# Patient Record
Sex: Female | Born: 1960 | Race: White | Hispanic: No | Smoking: Never smoker
Health system: Southern US, Community
[De-identification: ages and names within clinical notes are randomized; demographics above are authoritative.]

## PROBLEM LIST (undated history)

## (undated) DIAGNOSIS — K5010 Crohn's disease of large intestine without complications: Secondary | ICD-10-CM

## (undated) DIAGNOSIS — I10 Essential (primary) hypertension: Secondary | ICD-10-CM

---

## 2018-07-14 ENCOUNTER — Inpatient Hospital Stay (HOSPITAL_COMMUNITY)
Admission: EM | Admit: 2018-07-14 | Discharge: 2018-07-16 | DRG: 065 | Disposition: A | Discharge status: Home / Self Care | Payer: PRIVATE HEALTH INSURANCE | Attending: Internal Medicine | Admitting: Internal Medicine

## 2018-07-14 ENCOUNTER — Emergency Department (HOSPITAL_COMMUNITY): Payer: PRIVATE HEALTH INSURANCE

## 2018-07-14 ENCOUNTER — Encounter (HOSPITAL_COMMUNITY): Payer: Self-pay | Admitting: Emergency Medicine

## 2018-07-14 ENCOUNTER — Other Ambulatory Visit: Payer: Self-pay

## 2018-07-14 DIAGNOSIS — Z823 Family history of stroke: Secondary | ICD-10-CM

## 2018-07-14 DIAGNOSIS — Z9049 Acquired absence of other specified parts of digestive tract: Secondary | ICD-10-CM

## 2018-07-14 DIAGNOSIS — Z881 Allergy status to other antibiotic agents status: Secondary | ICD-10-CM

## 2018-07-14 DIAGNOSIS — K5010 Crohn's disease of large intestine without complications: Secondary | ICD-10-CM | POA: Diagnosis present

## 2018-07-14 DIAGNOSIS — E785 Hyperlipidemia, unspecified: Secondary | ICD-10-CM | POA: Diagnosis present

## 2018-07-14 DIAGNOSIS — R471 Dysarthria and anarthria: Secondary | ICD-10-CM | POA: Diagnosis present

## 2018-07-14 DIAGNOSIS — Z79899 Other long term (current) drug therapy: Secondary | ICD-10-CM

## 2018-07-14 DIAGNOSIS — Z882 Allergy status to sulfonamides status: Secondary | ICD-10-CM

## 2018-07-14 DIAGNOSIS — N179 Acute kidney failure, unspecified: Secondary | ICD-10-CM | POA: Diagnosis not present

## 2018-07-14 DIAGNOSIS — I639 Cerebral infarction, unspecified: Principal | ICD-10-CM | POA: Diagnosis present

## 2018-07-14 DIAGNOSIS — Z7952 Long term (current) use of systemic steroids: Secondary | ICD-10-CM

## 2018-07-14 DIAGNOSIS — R040 Epistaxis: Secondary | ICD-10-CM | POA: Diagnosis not present

## 2018-07-14 DIAGNOSIS — I63541 Cerebral infarction due to unspecified occlusion or stenosis of right cerebellar artery: Secondary | ICD-10-CM

## 2018-07-14 DIAGNOSIS — I480 Paroxysmal atrial fibrillation: Secondary | ICD-10-CM | POA: Diagnosis present

## 2018-07-14 DIAGNOSIS — R29702 NIHSS score 2: Secondary | ICD-10-CM | POA: Diagnosis present

## 2018-07-14 DIAGNOSIS — K5090 Crohn's disease, unspecified, without complications: Secondary | ICD-10-CM

## 2018-07-14 DIAGNOSIS — G8324 Monoplegia of upper limb affecting left nondominant side: Secondary | ICD-10-CM | POA: Diagnosis present

## 2018-07-14 DIAGNOSIS — Z20828 Contact with and (suspected) exposure to other viral communicable diseases: Secondary | ICD-10-CM | POA: Diagnosis present

## 2018-07-14 DIAGNOSIS — T380X5A Adverse effect of glucocorticoids and synthetic analogues, initial encounter: Secondary | ICD-10-CM | POA: Diagnosis present

## 2018-07-14 DIAGNOSIS — Z7902 Long term (current) use of antithrombotics/antiplatelets: Secondary | ICD-10-CM

## 2018-07-14 DIAGNOSIS — Z8249 Family history of ischemic heart disease and other diseases of the circulatory system: Secondary | ICD-10-CM

## 2018-07-14 DIAGNOSIS — I10 Essential (primary) hypertension: Secondary | ICD-10-CM | POA: Diagnosis present

## 2018-07-14 DIAGNOSIS — Z880 Allergy status to penicillin: Secondary | ICD-10-CM

## 2018-07-14 DIAGNOSIS — Z886 Allergy status to analgesic agent status: Secondary | ICD-10-CM

## 2018-07-14 DIAGNOSIS — E860 Dehydration: Secondary | ICD-10-CM | POA: Diagnosis present

## 2018-07-14 HISTORY — DX: Essential (primary) hypertension: I10

## 2018-07-14 HISTORY — DX: Crohn's disease of large intestine without complications: K50.10

## 2018-07-14 LAB — CBC
HCT: 38.7 % (ref 36.0–46.0)
Hemoglobin: 12.5 g/dL (ref 12.0–15.0)
MCH: 29.3 pg (ref 26.0–34.0)
MCHC: 32.3 g/dL (ref 30.0–36.0)
MCV: 90.6 fL (ref 80.0–100.0)
Platelets: 286 10*3/uL (ref 150–400)
RBC: 4.27 MIL/uL (ref 3.87–5.11)
RDW: 12.8 % (ref 11.5–15.5)
WBC: 19.1 10*3/uL — ABNORMAL HIGH (ref 4.0–10.5)
nRBC: 0 % (ref 0.0–0.2)

## 2018-07-14 LAB — DIFFERENTIAL
Abs Immature Granulocytes: 0.11 10*3/uL — ABNORMAL HIGH (ref 0.00–0.07)
Basophils Absolute: 0.1 10*3/uL (ref 0.0–0.1)
Basophils Relative: 1 %
Eosinophils Absolute: 0.2 10*3/uL (ref 0.0–0.5)
Eosinophils Relative: 1 %
Immature Granulocytes: 1 %
Lymphocytes Relative: 5 %
Lymphs Abs: 1 10*3/uL (ref 0.7–4.0)
Monocytes Absolute: 0.7 10*3/uL (ref 0.1–1.0)
Monocytes Relative: 4 %
Neutro Abs: 17 10*3/uL — ABNORMAL HIGH (ref 1.7–7.7)
Neutrophils Relative %: 88 %

## 2018-07-14 LAB — HEMOGLOBIN A1C
Hgb A1c MFr Bld: 5.1 % (ref 4.8–5.6)
Mean Plasma Glucose: 99.67 mg/dL

## 2018-07-14 LAB — COMPREHENSIVE METABOLIC PANEL
ALT: 18 U/L (ref 0–44)
AST: 25 U/L (ref 15–41)
Albumin: 3.3 g/dL — ABNORMAL LOW (ref 3.5–5.0)
Alkaline Phosphatase: 79 U/L (ref 38–126)
Anion gap: 11 (ref 5–15)
BUN: 24 mg/dL — ABNORMAL HIGH (ref 6–20)
CO2: 15 mmol/L — ABNORMAL LOW (ref 22–32)
Calcium: 8.7 mg/dL — ABNORMAL LOW (ref 8.9–10.3)
Chloride: 109 mmol/L (ref 98–111)
Creatinine, Ser: 1.51 mg/dL — ABNORMAL HIGH (ref 0.44–1.00)
GFR calc Af Amer: 43 mL/min — ABNORMAL LOW (ref >60)
GFR calc non Af Amer: 37 mL/min — ABNORMAL LOW (ref >60)
Glucose, Bld: 94 mg/dL (ref 70–99)
Potassium: 4.4 mmol/L (ref 3.5–5.1)
Sodium: 135 mmol/L (ref 135–145)
Total Bilirubin: 0.6 mg/dL (ref 0.3–1.2)
Total Protein: 5.7 g/dL — ABNORMAL LOW (ref 6.5–8.1)

## 2018-07-14 LAB — APTT: aPTT: 32 seconds (ref 24–36)

## 2018-07-14 LAB — LIPID PANEL
Cholesterol: 190 mg/dL (ref 0–200)
HDL: 58 mg/dL (ref >40)
LDL Cholesterol: 103 mg/dL — ABNORMAL HIGH (ref 0–99)
Total CHOL/HDL Ratio: 3.3 RATIO
Triglycerides: 144 mg/dL (ref <150)
VLDL: 29 mg/dL (ref 0–40)

## 2018-07-14 LAB — PROTIME-INR
INR: 1.1 (ref 0.8–1.2)
Prothrombin Time: 14.1 seconds (ref 11.4–15.2)

## 2018-07-14 MED ORDER — SENNOSIDES-DOCUSATE SODIUM 8.6-50 MG PO TABS: 1.0000 | ORAL_TABLET | Freq: Every evening | ORAL | Status: DC | PRN

## 2018-07-14 MED ORDER — SODIUM CHLORIDE 0.9 % IV BOLUS
1000.0000 mL | Freq: Once | INTRAVENOUS | Status: AC
  Administered 2018-07-14: 18:00:00 1000 mL via INTRAVENOUS

## 2018-07-14 MED ORDER — STROKE: EARLY STAGES OF RECOVERY BOOK
Freq: Once | Status: DC
  Filled 2018-07-14: qty 1

## 2018-07-14 MED ORDER — ACETAMINOPHEN 650 MG RE SUPP: 650.0000 mg | RECTAL | Status: DC | PRN

## 2018-07-14 MED ORDER — CLOPIDOGREL BISULFATE 75 MG PO TABS: 75.0000 mg | ORAL_TABLET | Freq: Every day | ORAL | Status: DC

## 2018-07-14 MED ORDER — CLOPIDOGREL BISULFATE 300 MG PO TABS: 600.0000 mg | ORAL_TABLET | Freq: Once | ORAL | Status: DC

## 2018-07-14 MED ORDER — ENOXAPARIN SODIUM 30 MG/0.3ML ~~LOC~~ SOLN
30.0000 mg | SUBCUTANEOUS | Status: DC
  Administered 2018-07-15 – 2018-07-16 (×2): 30 mg via SUBCUTANEOUS
  Filled 2018-07-14 (×2): qty 0.3

## 2018-07-14 MED ORDER — PREDNISONE 5 MG PO TABS
5.0000 mg | ORAL_TABLET | Freq: Every day | ORAL | Status: DC
  Administered 2018-07-15 – 2018-07-16 (×2): 5 mg via ORAL
  Filled 2018-07-14 (×2): qty 1

## 2018-07-14 MED ORDER — MORPHINE SULFATE (PF) 4 MG/ML IV SOLN: 4.0000 mg | Freq: Once | INTRAVENOUS | Status: DC

## 2018-07-14 MED ORDER — ATORVASTATIN CALCIUM 80 MG PO TABS
80.0000 mg | ORAL_TABLET | Freq: Every day | ORAL | Status: DC
  Administered 2018-07-14 – 2018-07-16 (×3): 80 mg via ORAL
  Filled 2018-07-14 (×3): qty 1

## 2018-07-14 MED ORDER — SODIUM CHLORIDE 0.9 % IV BOLUS: 500.0000 mL | Freq: Once | INTRAVENOUS | Status: DC

## 2018-07-14 MED ORDER — ACETAMINOPHEN 325 MG PO TABS
650.0000 mg | ORAL_TABLET | ORAL | Status: DC | PRN
  Administered 2018-07-15: 650 mg via ORAL
  Filled 2018-07-14: qty 2

## 2018-07-14 MED ORDER — CLOPIDOGREL BISULFATE 75 MG PO TABS
75.0000 mg | ORAL_TABLET | Freq: Every day | ORAL | Status: DC
  Administered 2018-07-14 – 2018-07-16 (×3): 75 mg via ORAL
  Filled 2018-07-14 (×3): qty 1

## 2018-07-14 MED ORDER — ACETAMINOPHEN 160 MG/5ML PO SOLN: 650.0000 mg | ORAL | Status: DC | PRN

## 2018-07-14 NOTE — ED Notes (Signed)
ED TO INPATIENT HANDOFF REPORT  ED Nurse Name and Phone #: William Hamburger [phone]  S Name/Age/Gender Patricia Livingston 58 y.o. female Room/Bed: 034C/034C  Code Status   Code Status: Full Code  Home/SNF/Other Home Patient oriented to: situation Is this baseline? No   Triage Complete: Triage complete  Chief Complaint left side weakness  Triage Note Patient reports she threw her neck back Sunday and felt a pop, she then began having numbness in bilateral hands, states R hand numbness resolved but left did not. She noticed weakness to her left side as well as slurred speech yesterday at noon. Patient ambulatory, a/ox4, speech clear, face symmetrical.    Allergies Allergies  Allergen Reactions  . Penicillins Anaphylaxis and Other (See Comments)    Did it involve swelling of the face/tongue/throat, SOB, or low BP? Yes Did it involve sudden or severe rash/hives, skin peeling, or any reaction on the inside of your mouth or nose? Yes Did you need to seek medical attention at a hospital or doctor's office? Yes When did it last happen?childhood If all above answers are "NO", may proceed with cephalosporin use.   . Aspirin Itching, Rash and Tinitus  . Sulfa Antibiotics Hives, Swelling and Rash    Level of Care/Admitting Diagnosis ED Disposition    ED Disposition Condition Comment   Admit  Hospital Area: [facility] [[id number]]  Level of Care: Telemetry Cardiac [103]  Covid Evaluation: Screening Protocol (No Symptoms)  Diagnosis: CVA (cerebral vascular accident) [facility]) [[id number]]  Admitting Physician: Aldine Contes [phone]  Attending Physician: Aldine Contes [[id number]]  PT Class (Do Not Modify): Observation [104]  PT Acc Code (Do Not Modify): Observation [10022]       B Medical/Surgery History Past Medical History:  Diagnosis Date  . Crohn's colitis ([place])   . Hypertension    History reviewed. No pertinent surgical history.   A IV  Location/Drains/Wounds Patient Lines/Drains/Airways Status   Active Line/Drains/Airways    Name:   Placement date:   Placement time:   Site:   Days:   Peripheral IV 07/14/18 Left;Medial Hand   07/14/18    1120    Hand   less than 1          Intake/Output Last 24 hours No intake or output data in the 24 hours ending 07/14/18 1807  Labs/Imaging Results for orders placed or performed during the hospital encounter of 07/14/18 (from the past 48 hour(s))  CBC     Status: Abnormal   Collection Time: 07/14/18 11:10 AM  Result Value Ref Range   WBC 19.1 (H) 4.0 - 10.5 K/uL   RBC 4.27 3.87 - 5.11 MIL/uL   Hemoglobin 12.5 12.0 - 15.0 g/dL   HCT 38.7 36.0 - 46.0 %   MCV 90.6 80.0 - 100.0 fL   MCH 29.3 26.0 - 34.0 pg   MCHC 32.3 30.0 - 36.0 g/dL   RDW 12.8 11.5 - 15.5 %   Platelets 286 150 - 400 K/uL   nRBC 0.0 0.0 - 0.2 %    Comment: Performed at [facility], [place] [address] [place], [place] [address]  Differential     Status: Abnormal   Collection Time: 07/14/18 11:10 AM  Result Value Ref Range   Neutrophils Relative % 88 %   Neutro Abs 17.0 (H) 1.7 - 7.7 K/uL   Lymphocytes Relative 5 %   Lymphs Abs 1.0 0.7 - 4.0 K/uL   Monocytes Relative 4 %   Monocytes Absolute 0.7 0.1 -  1.0 K/uL   Eosinophils Relative 1 %   Eosinophils Absolute 0.2 0.0 - 0.5 K/uL   Basophils Relative 1 %   Basophils Absolute 0.1 0.0 - 0.1 K/uL   Immature Granulocytes 1 %   Abs Immature Granulocytes 0.11 (H) 0.00 - 0.07 K/uL    Comment: Performed at [facility], [address] [place], [place] [address]  Comprehensive metabolic panel     Status: Abnormal   Collection Time: 07/14/18 11:10 AM  Result Value Ref Range   Sodium 135 135 - 145 mmol/L   Potassium 4.4 3.5 - 5.1 mmol/L   Chloride 109 98 - 111 mmol/L   CO2 15 (L) 22 - 32 mmol/L   Glucose, Bld 94 70 - 99 mg/dL   BUN 24 (H) 6 - 20 mg/dL   Creatinine, Ser 1.911.51 (H) 0.44 - 1.00 mg/dL   Calcium 8.7 (L) 8.9 - 10.3 mg/dL   Total Protein 5.7  (L) 6.5 - 8.1 g/dL   Albumin 3.3 (L) 3.5 - 5.0 g/dL   AST 25 15 - 41 U/L   ALT 18 0 - 44 U/L   Alkaline Phosphatase 79 38 - 126 U/L   Total Bilirubin 0.6 0.3 - 1.2 mg/dL   GFR calc non Af Amer 37 (L) >60 mL/min   GFR calc Af Amer 43 (L) >60 mL/min   Anion gap 11 5 - 15    Comment: Performed at [facility], [address] [place], [place] [address]  Protime-INR     Status: None   Collection Time: 07/14/18 12:10 PM  Result Value Ref Range   Prothrombin Time 14.1 11.4 - 15.2 seconds   INR 1.1 0.8 - 1.2    Comment: (NOTE) INR goal varies based on device and disease states. Performed at [facility], [address] [place], [place] [address]   APTT     Status: None   Collection Time: 07/14/18 12:10 PM  Result Value Ref Range   aPTT 32 24 - 36 seconds    Comment: Performed at [facility], [address] [place], [place] [address]  Hemoglobin A1c     Status: None   Collection Time: 07/14/18  5:30 PM  Result Value Ref Range   Hgb A1c MFr Bld 5.1 4.8 - 5.6 %    Comment: (NOTE) Pre diabetes:          5.7%-6.4% Diabetes:              >6.4% Glycemic control for   <7.0% adults with diabetes    Mean Plasma Glucose 99.67 mg/dL    Comment: Performed at [facility], [address] [place], [place] [address]   Ct Head Wo Contrast  Result Date: 07/14/2018 CLINICAL DATA:  Neck injury, popping sensation, numbness and weakness EXAM: CT HEAD WITHOUT CONTRAST CT CERVICAL SPINE WITHOUT CONTRAST TECHNIQUE: Multidetector CT imaging of the head and cervical spine was performed following the standard protocol without intravenous contrast. Multiplanar CT image reconstructions of the cervical spine were also generated. COMPARISON:  None. FINDINGS: CT HEAD FINDINGS Brain: There is subtle hypodensity of the right frontoparietal junction deep white matter (series 4, image 20, 19) Vascular: No hyperdense vessel or unexpected calcification. Skull: Normal. Negative for fracture or  focal lesion. Sinuses/Orbits: No acute finding. Other: None. CT CERVICAL SPINE FINDINGS Alignment: Normal. Skull base and vertebrae: No acute fracture. No primary bone lesion or focal pathologic process. Soft tissues and spinal canal: No  prevertebral fluid or swelling. No visible canal hematoma. Disc levels: Minimal multilevel disc space height loss and osteophytosis. Upper chest: Negative. Other: None. IMPRESSION: 1. There is subtle hypodensity of the right frontoparietal junction deep white matter (series 4, image 20, 19), concerning for acute to subacute infarction. MRI may be used to further evaluate for acute diffusion restriction if desired. 2. No fracture or static subluxation of the cervical spine. Minimal multilevel disc space height loss and osteophytosis. MRI may be used to more sensitively evaluate for cervical disc and neural foraminal pathology if desired. Electronically Signed   By: Lauralyn PrimesAlex  Bibbey M.D.   On: 07/14/2018 12:08   Ct Cervical Spine Wo Contrast  Result Date: 07/14/2018 CLINICAL DATA:  Neck injury, popping sensation, numbness and weakness EXAM: CT HEAD WITHOUT CONTRAST CT CERVICAL SPINE WITHOUT CONTRAST TECHNIQUE: Multidetector CT imaging of the head and cervical spine was performed following the standard protocol without intravenous contrast. Multiplanar CT image reconstructions of the cervical spine were also generated. COMPARISON:  None. FINDINGS: CT HEAD FINDINGS Brain: There is subtle hypodensity of the right frontoparietal junction deep white matter (series 4, image 20, 19) Vascular: No hyperdense vessel or unexpected calcification. Skull: Normal. Negative for fracture or focal lesion. Sinuses/Orbits: No acute finding. Other: None. CT CERVICAL SPINE FINDINGS Alignment: Normal. Skull base and vertebrae: No acute fracture. No primary bone lesion or focal pathologic process. Soft tissues and spinal canal: No prevertebral fluid or swelling. No visible canal hematoma. Disc levels:  Minimal multilevel disc space height loss and osteophytosis. Upper chest: Negative. Other: None. IMPRESSION: 1. There is subtle hypodensity of the right frontoparietal junction deep white matter (series 4, image 20, 19), concerning for acute to subacute infarction. MRI may be used to further evaluate for acute diffusion restriction if desired. 2. No fracture or static subluxation of the cervical spine. Minimal multilevel disc space height loss and osteophytosis. MRI may be used to more sensitively evaluate for cervical disc and neural foraminal pathology if desired. Electronically Signed   By: Lauralyn PrimesAlex  Bibbey M.D.   On: 07/14/2018 12:08   Mr Angio Head Wo Contrast  Result Date: 07/14/2018 CLINICAL DATA:  Left-sided weakness.  Abnormal head CT. EXAM: MRI HEAD WITHOUT CONTRAST MRA HEAD WITHOUT CONTRAST TECHNIQUE: Multiplanar, multiecho pulse sequences of the brain and surrounding structures were obtained without intravenous contrast. Angiographic images of the head were obtained using MRA technique without contrast. COMPARISON:  CT studies same day FINDINGS: MRI HEAD FINDINGS Brain: Diffusion imaging shows patchy acute infarction at the right frontoparietal junction region, primarily involving the precentral gyrus but also with some involvement of the post central gyrus. There are a few punctate acute infarctions in both parietal lobes. 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 Elsewhere, brainstem is normal. There are old small vessel cerebellar infarctions. Cerebral hemispheres elsewhere show moderate chronic small-vessel changes of the deep and subcortical white matter. No mass lesion, hydrocephalus or extra-axial collection. Vascular: Major vessels at the base of the brain show flow. Skull and upper cervical spine: Negative Sinuses/Orbits: Paranasal sinuses are clear. Small mastoid effusion on the right orbits negative. Other: None MRA HEAD FINDINGS Both internal carotid  arteries are widely patent through the skull base and siphon regions. Cannot rule out fibromuscular change in both upper cervical internal carotids. The anterior and middle cerebral vessels are patent without proximal stenosis, aneurysm or vascular malformation. Both vertebral arteries are widely patent to the basilar. No basilar stenosis. Posterior circulation branch vessels are normal.  IMPRESSION: Patchy acute infarctions at the right frontoparietal junction involving both the precentral and postcentral gyri. Few other punctate acute infarctions in both parietal lobes. 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 Background pattern of chronic small vessel ischemic changes of the cerebellum and hemispheric white matter. Negative intracranial MR angiography of the large and medium size vessels. Possible fibromuscular change of the upper cervical internal carotid arteries. Small right mastoid effusion. Electronically Signed   By: Paulina FusiMark  Shogry M.D.   On: 07/14/2018 15:58   Mr Brain Wo Contrast  Result Date: 07/14/2018 CLINICAL DATA:  Left-sided weakness.  Abnormal head CT. EXAM: MRI HEAD WITHOUT CONTRAST MRA HEAD WITHOUT CONTRAST TECHNIQUE: Multiplanar, multiecho pulse sequences of the brain and surrounding structures were obtained without intravenous contrast. Angiographic images of the head were obtained using MRA technique without contrast. COMPARISON:  CT studies same day FINDINGS: MRI HEAD FINDINGS Brain: Diffusion imaging shows patchy acute infarction at the right frontoparietal junction region, primarily involving the precentral gyrus but also with some involvement of the post central gyrus. There are a few punctate acute infarctions in both parietal lobes. 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 Elsewhere, brainstem is normal. There are old small vessel cerebellar infarctions. Cerebral hemispheres elsewhere show  moderate chronic small-vessel changes of the deep and subcortical white matter. No mass lesion, hydrocephalus or extra-axial collection. Vascular: Major vessels at the base of the brain show flow. Skull and upper cervical spine: Negative Sinuses/Orbits: Paranasal sinuses are clear. Small mastoid effusion on the right orbits negative. Other: None MRA HEAD FINDINGS Both internal carotid arteries are widely patent through the skull base and siphon regions. Cannot rule out fibromuscular change in both upper cervical internal carotids. The anterior and middle cerebral vessels are patent without proximal stenosis, aneurysm or vascular malformation. Both vertebral arteries are widely patent to the basilar. No basilar stenosis. Posterior circulation branch vessels are normal. IMPRESSION: Patchy acute infarctions at the right frontoparietal junction involving both the precentral and postcentral gyri. Few other punctate acute infarctions in both parietal lobes. 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 Background pattern of chronic small vessel ischemic changes of the cerebellum and hemispheric white matter. Negative intracranial MR angiography of the large and medium size vessels. Possible fibromuscular change of the upper cervical internal carotid arteries. Small right mastoid effusion. Electronically Signed   By: Paulina FusiMark  Shogry M.D.   On: 07/14/2018 15:58    Pending Labs Unresulted Labs (From admission, onward)    Start     Ordered   07/15/18 0500  Basic metabolic panel  Tomorrow morning,   R     07/14/18 1653   07/14/18 1653  HIV antibody (Routine Testing)  Once,   STAT     07/14/18 1652   07/14/18 1651  Lipid panel  Once,   STAT    Comments: Fasting    07/14/18 1652   07/14/18 1232  Novel Coronavirus,NAA,(SEND-OUT TO REF LAB - TAT 24-48 hrs); Hosp Order  (Asymptomatic Patients Labs)  Once,   STAT    Question:  Rule Out  Answer:  Yes   07/14/18 1231           Vitals/Pain Today's Vitals   07/14/18 1232 07/14/18 1604 07/14/18 1605 07/14/18 1700  BP: 139/78 122/77  (!) 148/72  Pulse: 67 65  74  Resp: 12 15  18   Temp:      TempSrc:  SpO2: 99% 100%  99%  Weight:      Height:      PainSc:   3      Isolation Precautions No active isolations  Medications Medications   stroke: mapping our early stages of recovery book (has no administration in time range)  acetaminophen (TYLENOL) tablet 650 mg (has no administration in time range)    Or  acetaminophen (TYLENOL) solution 650 mg (has no administration in time range)    Or  acetaminophen (TYLENOL) suppository 650 mg (has no administration in time range)  senna-docusate (Senokot-S) tablet 1 tablet (has no administration in time range)  enoxaparin (LOVENOX) injection 30 mg (has no administration in time range)  predniSONE (DELTASONE) tablet 5 mg (has no administration in time range)  clopidogrel (PLAVIX) tablet 600 mg (has no administration in time range)    Followed by  clopidogrel (PLAVIX) tablet 75 mg (has no administration in time range)  atorvastatin (LIPITOR) tablet 80 mg (has no administration in time range)  sodium chloride 0.9 % bolus 1,000 mL (has no administration in time range)    Mobility walks Low fall risk   Focused Assessments Neuro Assessment Handoff:  Swallow screen pass? Yes    NIH Stroke Scale ( + Modified Stroke Scale Criteria)  Interval: Initial Level of Consciousness (1a.)   : Alert, keenly responsive LOC Questions (1b. )   +: Answers both questions correctly LOC Commands (1c. )   + : Performs both tasks correctly Best Gaze (2. )  +: Normal Visual (3. )  +: No visual loss Facial Palsy (4. )    : Normal symmetrical movements Motor Arm, Left (5a. )   +: No drift Motor Arm, Right (5b. )   +: No drift Motor Leg, Left (6a. )   +: No drift Motor Leg, Right (6b. )   +: No drift Limb Ataxia (7. ): Absent Sensory (8. )   +: Mild-to-moderate sensory  loss, patient feels pinprick is less sharp or is dull on the affected side, or there is a loss of superficial pain with pinprick, but patient is aware of being touched Best Language (9. )   +: No aphasia Dysarthria (10. ): Normal Extinction/Inattention (11.)   +: No Abnormality Modified SS Total  +: 1 Complete NIHSS TOTAL: 1 Last date known well: 07/13/18 Last time known well: 1200 Neuro Assessment:   Neuro Checks:   Initial (07/14/18 1122)  Last Documented NIHSS Modified Score: 1 (07/14/18 1604) Has TPA been given? No If patient is a Neuro Trauma and patient is going to OR before floor call report to 4N Charge nurse: [phone] or [phone]     R Recommendations: See Admitting Provider Note  Report given to:   Additional Notes: NIH: 1 for sensory; passes swallow screen. A&O x4; up ad lib; Pending MRA.

## 2018-07-14 NOTE — ED Notes (Signed)
Pt returned from CT °

## 2018-07-14 NOTE — ED Notes (Signed)
Patient transported to CT 

## 2018-07-14 NOTE — ED Provider Notes (Signed)
Pt seen by Dr Venora Maples.  Please see his note.  MRI does reveal an acute stroke.  Discussed findings with patient.  Will admit for further workup.  Neurology consulted.  Medical service consulted for admission.   Dorie Rank, MD 07/14/18 [phone]

## 2018-07-14 NOTE — ED Notes (Signed)
Pt in MRI.

## 2018-07-14 NOTE — ED Notes (Signed)
Patient assessed at the bridge by Dr. Venora Maples who advised patient not a code stroke at this time

## 2018-07-14 NOTE — ED Triage Notes (Signed)
Patient reports she threw her neck back Sunday and felt a pop, she then began having numbness in bilateral hands, states R hand numbness resolved but left did not. She noticed weakness to her left side as well as slurred speech yesterday at noon. Patient ambulatory, a/ox4, speech clear, face symmetrical.

## 2018-07-14 NOTE — H&P (Signed)
Date: 07/14/2018               Patient Name:  Patricia Livingston MRN: [id number]  DOB: 27-Nov-1960 Age / Sex: 58 y.o., female   PCP: [facility] [place] Service: Internal Medicine Teaching Service         Attending Physician: Dr. Dorie Rank, MD    First Contact: Dr. Gilberto Better Pager: [phone]  Second Contact: Dr. Kathi Ludwig Pager: [phone]       After Hours (After 5p/  First Contact Pager: [phone]  weekends / holidays): Second Contact Pager: [phone]   Chief Complaint: Left arm weakness  History of Present Illness:  Patricia Livingston is a 58 yo F w/ PMH of Crohn's disease s/p colectomy and HTN presenting to [facility] with complaints of left arm weakness. She was in her usual state of health until last Sunday when she was dozing off while sitting upright and swung her neck back with pain in her neck associated with numbness in her left arm. Pain subsided but noticed that her left arm felt weak and she had difficulty with coordination. She initially thought the symptoms would disappear with time but yesterday while at work, she noticed worsening weakness of her left arm with dysarthria and ambulatory dysfunction so she came to [facility] today for evaluation. She states she has never had similar symptoms in the past. She denies any hx of dysrhythmia, cardiac dysfunction, lower extremity swelling or long travels. Also denies fever, chills, nausea, vomiting, diarrhea.  In the ED, she was had a CT head and C-spine with acute vs subacute infarct noted on right frontoparietal junction. Follow up MRI/MRA showed acute infarctions at right frontoparietal junction as well as few in both parietal lobes.   Meds:  Current Meds  Medication Sig  . Cyanocobalamin (VITAMIN B-12) 2500 MCG SUBL Place 1 tablet under the tongue daily.  Marland Kitchen lisinopril (ZESTRIL) 20 MG tablet Take 20 mg by mouth daily.  . metoprolol tartrate (LOPRESSOR) 50 MG tablet Take 50 mg by mouth daily.  . Pediatric Multiple  Vit-C-FA (FLINSTONES GUMMIES OMEGA-3 DHA PO) Take 2 tablets by mouth daily.  . predniSONE (DELTASONE) 5 MG tablet Take 5 mg by mouth daily.   Allergies: Allergies as of 07/14/2018 - Review Complete 07/14/2018  Allergen Reaction Noted  . Penicillins Anaphylaxis and Other (See Comments) 07/14/2018  . Aspirin Itching, Rash, and Tinitus 07/14/2018  . Sulfa antibiotics Hives, Swelling, and Rash 07/14/2018   History reviewed. No pertinent past medical history.  Family History:  No significant family history Family History  Problem Relation Age of Onset  . Hypertension Mother   . Hypertension Father    Social History: Lives alone. 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 Denies tobacco, illicit substance use. Occasional alcohol drinker but does not keep alcohol at home.  Review of Systems: A complete ROS was negative except as per HPI.  Physical Exam: Blood pressure 122/77, pulse 65, temperature 97.7 F (36.5 C), temperature source Oral, resp. rate 15, height 5\' 6"  (1.676 m), weight 74.4 kg, SpO2 100 %.  Gen: Well-developed, well nourished, NAD HEENT: EOMI, PERRL, No nasal discharge, MMM Neck: supple, ROM intact, no cervical adenopathy CV: RRR, S1, S2 normal, No rubs, no murmurs Pulm: CTAB, No rales, no wheezes, no dullness to percussion  Abd: Soft, BS+, NTND Extm: ROM intact, Peripheral pulses intact, No peripheral edema Skin: Dry, Warm, normal turgor Neurologic exam: Mental status:  A&Ox3 Cranial Nerves:             II: PERRL             III, IV, VI: Extra-occular motions intact bilaterally             V, VII: Face symmetric, sensation intact in all 3 divisions               VIII: hearing normal to rubbing fingers bilaterally               IX, X: palate rises symmetrically             XI: Head turn and shoulder shrug normal bilaterally               XII: tongue midline    Motor: Strength 5/5 RUE, 3/5 LUE, 5/5 RLE, 5/5 LLE, fixed contracture of left hand Sensory:  Light touch intact and symmetric bilaterally  Coordination: There is no dysmetria on finger-to-nose. Heel to shin test normal. Psychiatric: Normal mood and affect  EKG: personally reviewed my interpretation is no prior EKG to compare, normal sinus, normal axis, T wave inversion in V1  CT HEAD/Cervical Spine WITHOUT CONTRAST IMPRESSION: 1. There is subtle hypodensity of the right frontoparietal junction deep white matter (series 4, image 20, 19), concerning for acute to subacute infarction. MRI may be used to further evaluate for acute diffusion restriction if desired.  2. No fracture or static subluxation of the cervical spine. Minimal multilevel disc space height loss and osteophytosis. MRI may be used to more sensitively evaluate for cervical disc and neural foraminal pathology if desired.  MRI HEAD WITHOUT CONTRAST IMPRESSION: Patchy acute infarctions at the right frontoparietal junction involving both the precentral and postcentral gyri. Few other punctate acute infarctions in both parietal lobes. 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 Background pattern of chronic small vessel ischemic changes of the cerebellum and hemispheric white matter.  Negative intracranial MR angiography of the large and medium size vessels. Possible fibromuscular change of the upper cervical internal carotid arteries.  Small right mastoid effusion.  Assessment & Plan by Problem: Active Problems:   * No active hospital problems. *  Patricia Livingston is a 58 yo F w/ PMH of Crohn's disease s/p colectomy and HTN presenting with LUE weakness 2/2 multi-focal stroke. High likelihood of embolic origin based on distribution of stroke but unclear of origin as she has very low risk factors with no hx of tobacco use and arrhythmia. Will need further stroke work-up with echo, telemetry, MRA neck.  LUE weakness 2/2 multifocal stroke MRI head: acute infarctions  in right frontoparietal junction + both parietal lobes. MRA neck ordered in ED. No hx of diabetes or hypercholesteremia or tobacco use. - Neurology consult - Allow for permissive HTN in the setting of CVA (systolic < 220 and diastolic < 120) - Clopidogrel 600mg  in lieu of aspirin due to asa allergy - Atorvastatin 80mg  - TTE - F/u MRA neck - A1C, Lipid panel  - Telemonitoring  - SLP eval - PT/OT  AKI vs CKD Unclear baseline, admit BUN 24, Creatinine 1.51 Possibly from dehydration. - NaCl bolus 1000cc - Trend renal function - Avoid nephrotoxic meds  Hx of Crohn's Disease On prednisone 5mg . Wbc 19.1 most likely due to steroids. Pt mentions not on other medications for Crohn's dz due to being allergic - C/w prednisone 5mg  daily  HTN On lisinopril and metoprolol tartrate at home - Holding home  bp meds in setting of stroke  DVT prophx: Lovenox Diet: Regular Bowel: Senokot Code: Full  Dispo: Admit patient to Observation with expected length of stay less than 2 midnights.  Signed: Theotis Barrio, MD 07/14/2018, 4:35 PM  Pager: [phone]

## 2018-07-14 NOTE — Consult Note (Addendum)
Neurology Consultation  Reason for Consult: Stroke Referring Physician: Lynelle DoctorKnapp  CC: Left arm weakness and leg weakness  History is obtained from: Patient  HPI: Patricia Livingston is a 58 y.o. female with past medical history of hypertension and Crohn's colitis-on no immune suppression other than prednisone 5 mg daily.  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  On Sunday morning 07/09/2018 she was up and started to fall asleep at around 6.  She noted that her head was nodding off and falling forward then she suddenly felt a "snap" and right after felt as though her left greater then right hand and arm were slightly numb.  This subsided slightly and she did not think much of it.  Her right arm was better so she did not seek any medical attention.  But left-sided weakness persisted and today which is approximately 4/5 days later she felt great her left arm numbness and significant weakness in left hand.  For this reason she came in to be evaluated for stroke.  She states that she does not take aspirin as in the past she felt as though it caused her to have a rash.  Patient denies smoking, alcohol abuse or illicit drug use.  Patient denies any other symptoms other than the left arm and hand weakness.  Hand greater than proximal arm.  And some left leg weakness.  ED course: CT head, CT cervical spine, MRA, MRI of brain  LKW: 07/09/2018 tpa given?: no, out of the window Premorbid modified Rankin scale (mRS): 0 NIH stroke scale of 2  ROS:  ROS was performed and is negative except as noted in the HPI.    Past Medical History:  Diagnosis Date  . Crohn's colitis (HCC)   . Hypertension     Family History  Problem Relation Age of Onset  . Hypertension Mother   . Hypertension Father    Social History:  Patient does not smoke and states that she does not drink or abuse illicit drugs  Medications No current facility-administered medications for this encounter.   Current  Outpatient Medications:  .  Cyanocobalamin (VITAMIN B-12) 2500 MCG SUBL, Place 1 tablet under the tongue daily., Disp: , Rfl:  .  lisinopril (ZESTRIL) 20 MG tablet, Take 20 mg by mouth daily., Disp: , Rfl:  .  metoprolol tartrate (LOPRESSOR) 50 MG tablet, Take 50 mg by mouth daily., Disp: , Rfl:  .  Pediatric Multiple Vit-C-FA (FLINSTONES GUMMIES OMEGA-3 DHA PO), Take 2 tablets by mouth daily., Disp: , Rfl:  .  predniSONE (DELTASONE) 5 MG tablet, Take 5 mg by mouth daily., Disp: , Rfl:    Exam: Current vital signs: BP 122/77 (BP Location: Right Arm)   Pulse 65   Temp 97.7 F (36.5 C) (Oral)   Resp 15   Ht 5\' 6"  (1.676 m)   Wt 74.4 kg   SpO2 100%   BMI 26.47 kg/m  Vital signs in last 24 hours: Temp:  [97.7 F (36.5 C)] 97.7 F (36.5 C) (06/19 1102) Pulse Rate:  [64-68] 65 (06/19 1604) Resp:  [12-20] 15 (06/19 1604) BP: (122-150)/(77-81) 122/77 (06/19 1604) SpO2:  [99 %-100 %] 100 % (06/19 1604) Weight:  [74.4 kg] 74.4 kg (06/19 1113)  Physical Exam  Constitutional: Appears well-developed and well-nourished.  Psych: Affect appropriate to situation Eyes: No scleral injection HENT: No OP obstrucion Head: Normocephalic.  Cardiovascular: Normal rate and regular rhythm.  Respiratory: Effort normal, non-labored breathing GI: Soft.  No distension. There is no tenderness.  Skin: WDI  Neuro: Mental Status: Patient is awake, alert, oriented to person, place, month, year, and situation. Patient is able to give a clear and coherent history. No signs of aphasia or neglect Cranial Nerves: II: Visual Fields are full.  III,IV, VI: EOMI without ptosis or diploplia. Pupils equal, round and reactive to light V: Facial sensation is symmetric to temperature VII: Facial movement is symmetric.  VIII: hearing is intact to voice X: Palat elevates symmetrically XI: Shoulder shrug is symmetric. XII: tongue is midline without atrophy or fasciculations.  Motor: Tone is normal. Bulk is  normal.  Left proximal arm is 4/5 with significant weakness in the left grip and extension of fingers.  Left leg has a 4+/5 strength more proximally than distally with plantar flexion and plantar extension 5/5 Sensory: Sensation decreased on the left arm and leg Deep Tendon Reflexes: 2+ and symmetric in the biceps and patellae.  Plantars: Toes are downgoing bilaterally.  Cerebellar: FNF and HKS are intact bilaterally  Labs I have reviewed labs in epic and the results pertinent to this consultation are:   CBC    Component Value Date/Time   WBC 19.1 (H) 07/14/2018 1110   RBC 4.27 07/14/2018 1110   HGB 12.5 07/14/2018 1110   HCT 38.7 07/14/2018 1110   PLT 286 07/14/2018 1110   MCV 90.6 07/14/2018 1110   MCH 29.3 07/14/2018 1110   MCHC 32.3 07/14/2018 1110   RDW 12.8 07/14/2018 1110   LYMPHSABS 1.0 07/14/2018 1110   MONOABS 0.7 07/14/2018 1110   EOSABS 0.2 07/14/2018 1110   BASOSABS 0.1 07/14/2018 1110    CMP     Component Value Date/Time   NA 135 07/14/2018 1110   K 4.4 07/14/2018 1110   CL 109 07/14/2018 1110   CO2 15 (L) 07/14/2018 1110   GLUCOSE 94 07/14/2018 1110   BUN 24 (H) 07/14/2018 1110   CREATININE 1.51 (H) 07/14/2018 1110   CALCIUM 8.7 (L) 07/14/2018 1110   PROT 5.7 (L) 07/14/2018 1110   ALBUMIN 3.3 (L) 07/14/2018 1110   AST 25 07/14/2018 1110   ALT 18 07/14/2018 1110   ALKPHOS 79 07/14/2018 1110   BILITOT 0.6 07/14/2018 1110   GFRNONAA 37 (L) 07/14/2018 1110   GFRAA 43 (L) 07/14/2018 1110    Imaging I have reviewed the images obtained:  CT-scan of the brain-there is subtle hypodensity of the right frontoparietal junction deep white matter, concerning for acute subacute infarct.  MRI examination of the brain- patchy acute infarcts in the right frontoparietal junction involving both the precentral and postcentral gyri.  Few other punctate acute infarcts in both parietal lobes.  Presence of bilateral infarctions suggesting of embolic disease from the  heart and or a sending aorta.  MR a of head-negative intracranial MRA of the large and medium sized vessels.  Possible fibromuscular changes in the upper cervical internal carotid arteries  Felicie Mornavid Smith PA-C [facility] [phone]  M-F  (9:00 am- 5:00 PM)  07/14/2018, 4:50 PM   Attending addendum Patient seen and examined independently. Last known normal sometime last Sunday when she was nodding off falling asleep and quickly pulled her neck back feeling a crick in her neck after that she had bilateral hand weakness in the left-sided weakness and numbness persisted.  Initially she thought this might have something to do with her neck and did not make much of it but because of the persistent symptoms came in for stroke evaluation. I independently  obtained review of systems and agree with the one documented above. On my examination NIH stroke scale 2 for left arm drift as well as decrease sensation in the left-although she had some slight weakness in the left leg as well but no vertical drift. I have independently obtained family maternal grandmother and father strokes in older age.  No strokes in young in the family.  Assessment:  58 year old female with bilateral infarcts with the majority of the infarcts in the right frontoparietal and precentral and postcentral gyri on the right.  As infarcts are bilateral in different vascular distributions likelihood is that this is cardiomegaly stroke. If no atrial fibrillation is found on work-up, she might need long-term outpatient cardiac monitoring. Other than the MRI changes listed above, she also has questionable FMD on MRA of the head-will need CTA head and neck for further evaluation.  Recommend #CTA neck to evaluate further for possible a sending aortic atherosclerosis and or plaques and FMD #Transthoracic Echo, may need TEE and loop monitor  # Start patient on PLVX 75mg  (allergic to ASA) #Start or continue Atorvastatin 80  mg/other high intensity statin # BP goal: permissive HTN upto  130/90 as she has had symptoms for greater than 3 days # HBAIC and Lipid profile # Telemetry monitoring # Frequent neuro checks # NPO until passes stroke swallow screen # please page stroke NP  Or  PA  Or MD from 8am -4 pm  as this patient from this time will be  followed by the stroke.   You can look them up on [email or web address]  Password [email or web address]  -- Amie Portland, MD [facility] Pager: [phone] If 7pm to 7am, please call on call as listed on AMION.

## 2018-07-14 NOTE — Plan of Care (Signed)
See previous note

## 2018-07-14 NOTE — ED Provider Notes (Signed)
[facility] Provider Note   CSN: [id number] Arrival date & time: 07/14/18  1055     History   Chief Complaint Chief Complaint  Patient presents with   Weakness    HPI Patricia Livingston is a 58 y.o. female.     HPI 58 year old female presents the emergency department with noted weakness of her left side over the past 2 days worsening yesterday with new associated slurred speech yesterday.  She was having bilateral arm persistent symptoms last week and some mild neck pain but the neck pain is resolved and her right arm is completely normalized but the left arm is continued causing issues especially over the past 48 hours.  Yesterday she had more difficulty walking and felt like she was slightly weak in the left leg and off balance.  No prior history of stroke.  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  She has a history of hypertension.  No prior history of stroke.   History reviewed. No pertinent past medical history.  There are no active problems to display for this patient.   History reviewed. No pertinent surgical history.   OB History   No obstetric history on file.      Home Medications    Prior to Admission medications   Medication Sig Start Date End Date Taking? Authorizing Provider  Cyanocobalamin (VITAMIN B-12) 2500 MCG SUBL Place 1 tablet under the tongue daily.   Yes [provider]  lisinopril (ZESTRIL) 20 MG tablet Take 20 mg by mouth daily. 06/02/18  Yes [provider]  metoprolol tartrate (LOPRESSOR) 50 MG tablet Take 50 mg by mouth daily. 06/02/18  Yes [provider]  Pediatric Multiple Vit-C-FA (FLINSTONES GUMMIES OMEGA-3 DHA PO) Take 2 tablets by mouth daily.   Yes [provider]  predniSONE (DELTASONE) 5 MG tablet Take 5 mg by mouth daily. 06/02/18  Yes [provider]    Family History No family history on file.  Social History Social History   Tobacco Use    Smoking status: Not on file  Substance Use Topics   Alcohol use: Not on file   Drug use: Not on file     Allergies   Penicillins, Aspirin, and Sulfa antibiotics   Review of Systems Review of Systems  All other systems reviewed and are negative.    Physical Exam Updated Vital Signs BP 139/78    Pulse 67    Temp 97.7 F (36.5 C) (Oral)    Resp 12    Ht 5\' 6"  (1.676 m)    Wt 74.4 kg    SpO2 99%    BMI 26.47 kg/m   Physical Exam Vitals signs and nursing note reviewed.  Constitutional:      General: She is not in acute distress.    Appearance: She is well-developed.  HENT:     Head: Normocephalic and atraumatic.  Neck:     Musculoskeletal: Normal range of motion.  Cardiovascular:     Rate and Rhythm: Normal rate and regular rhythm.     Heart sounds: Normal heart sounds.  Pulmonary:     Effort: Pulmonary effort is normal.     Breath sounds: Normal breath sounds.  Abdominal:     General: There is no distension.     Palpations: Abdomen is soft.     Tenderness: There is no abdominal tenderness.  Musculoskeletal: Normal range of motion.  Skin:    General: Skin is warm and dry.  Neurological:     Mental Status: She is alert and oriented to person, place, and time.     Comments: Mild dysarthric speech.  Weak grip strength of the left hand.  No obvious weakness of the left lower extremity  Psychiatric:        Judgment: Judgment normal.      ED Treatments / Results  Labs (all labs ordered are listed, but only abnormal results are displayed) Labs Reviewed  CBC - Abnormal; Notable for the following components:      Result Value   WBC 19.1 (*)    All other components within normal limits  DIFFERENTIAL - Abnormal; Notable for the following components:   Neutro Abs 17.0 (*)    Abs Immature Granulocytes 0.11 (*)    All other components within normal limits  COMPREHENSIVE METABOLIC PANEL - Abnormal; Notable for the following components:   CO2 15 (*)    BUN 24 (*)     Creatinine, Ser 1.51 (*)    Calcium 8.7 (*)    Total Protein 5.7 (*)    Albumin 3.3 (*)    GFR calc non Af Amer 37 (*)    GFR calc Af Amer 43 (*)    All other components within normal limits  NOVEL CORONAVIRUS, NAA (HOSPITAL ORDER, SEND-OUT TO REF LAB)  PROTIME-INR  APTT    EKG EKG Interpretation  Date/Time:  Friday July 14 2018 11:15:12 EDT Ventricular Rate:  63 PR Interval:    QRS Duration: 83 QT Interval:  410 QTC Calculation: 420 R Axis:   53 Text Interpretation:  Sinus rhythm Short PR interval No old tracing to compare Confirmed by Jola Schmidt [phone]) on 07/14/2018 3:57:40 PM   Radiology Ct Head Wo Contrast  Result Date: 07/14/2018 CLINICAL DATA:  Neck injury, popping sensation, numbness and weakness EXAM: CT HEAD WITHOUT CONTRAST CT CERVICAL SPINE WITHOUT CONTRAST TECHNIQUE: Multidetector CT imaging of the head and cervical spine was performed following the standard protocol without intravenous contrast. Multiplanar CT image reconstructions of the cervical spine were also generated. COMPARISON:  None. FINDINGS: CT HEAD FINDINGS Brain: There is subtle hypodensity of the right frontoparietal junction deep white matter (series 4, image 20, 19) Vascular: No hyperdense vessel or unexpected calcification. Skull: Normal. Negative for fracture or focal lesion. Sinuses/Orbits: No acute finding. Other: None. CT CERVICAL SPINE FINDINGS Alignment: Normal. Skull base and vertebrae: No acute fracture. No primary bone lesion or focal pathologic process. Soft tissues and spinal canal: No prevertebral fluid or swelling. No visible canal hematoma. Disc levels: Minimal multilevel disc space height loss and osteophytosis. Upper chest: Negative. Other: None. IMPRESSION: 1. There is subtle hypodensity of the right frontoparietal junction deep white matter (series 4, image 20, 19), concerning for acute to subacute infarction. MRI may be used to further evaluate for acute diffusion restriction if desired.  2. No fracture or static subluxation of the cervical spine. Minimal multilevel disc space height loss and osteophytosis. MRI may be used to more sensitively evaluate for cervical disc and neural foraminal pathology if desired. Electronically Signed   By: Eddie Candle M.D.   On: 07/14/2018 12:08   Ct Cervical Spine Wo Contrast  Result Date: 07/14/2018 CLINICAL DATA:  Neck injury, popping sensation, numbness and weakness EXAM: CT HEAD WITHOUT CONTRAST CT CERVICAL SPINE WITHOUT CONTRAST TECHNIQUE: Multidetector CT imaging of the head and cervical spine was performed following the standard protocol without intravenous contrast. Multiplanar CT image reconstructions of the cervical spine were also generated. COMPARISON:  None. FINDINGS: CT HEAD FINDINGS Brain: There is subtle hypodensity of the right frontoparietal junction deep white matter (series 4, image 20, 19) Vascular: No hyperdense vessel or unexpected calcification. Skull: Normal. Negative for fracture or focal lesion. Sinuses/Orbits: No acute finding. Other: None. CT CERVICAL SPINE FINDINGS Alignment: Normal. Skull base and vertebrae: No acute fracture. No primary bone lesion or focal pathologic process. Soft tissues and spinal canal: No prevertebral fluid or swelling. No visible canal hematoma. Disc levels: Minimal multilevel disc space height loss and osteophytosis. Upper chest: Negative. Other: None. IMPRESSION: 1. There is subtle hypodensity of the right frontoparietal junction deep white matter (series 4, image 20, 19), concerning for acute to subacute infarction. MRI may be used to further evaluate for acute diffusion restriction if desired. 2. No fracture or static subluxation of the cervical spine. Minimal multilevel disc space height loss and osteophytosis. MRI may be used to more sensitively evaluate for cervical disc and neural foraminal pathology if desired. Electronically Signed   By: Lauralyn Primes M.D.   On: 07/14/2018 12:08     Procedures Procedures (including critical care time)  Medications Ordered in ED Medications - No data to display   Initial Impression / Assessment and Plan / ED Course  I have reviewed the triage vital signs and the nursing notes.  Pertinent labs & imaging results that were available during my care of the patient were reviewed by me and considered in my medical decision making (see chart for details).        Presentation concerning for right brain stroke.  CT scan with subtle abnormality concerning for possible subacute stroke.  MRI pending at this time.  Care transferred to Dr. Lynelle Doctor at 3:58 PM to follow-up on MRI imaging   Final Clinical Impressions(s) / ED Diagnoses   Final diagnoses:  None    ED Discharge Orders    None       Azalia Bilis, MD 07/14/18 1558

## 2018-07-14 NOTE — Plan of Care (Signed)
Pt stable, able to be independent with mobility. Pt performs ADLs. Pt progressing well and is anxious to return home. 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 Creatinine was found to be elevated at arrival to hospital and she wishes to wait for CTA w/ contrast until she knows that her kidneys will not be adversely affected by contrast (her husband passed due to kidney issues). PIV started by IV team in right AC 20g in case she agrees to CTA on 07/15/18.

## 2018-07-15 ENCOUNTER — Observation Stay (HOSPITAL_COMMUNITY): Payer: PRIVATE HEALTH INSURANCE

## 2018-07-15 ENCOUNTER — Encounter (HOSPITAL_COMMUNITY): Payer: PRIVATE HEALTH INSURANCE

## 2018-07-15 ENCOUNTER — Encounter (HOSPITAL_COMMUNITY): Payer: Self-pay

## 2018-07-15 DIAGNOSIS — I63541 Cerebral infarction due to unspecified occlusion or stenosis of right cerebellar artery: Secondary | ICD-10-CM | POA: Diagnosis not present

## 2018-07-15 DIAGNOSIS — N179 Acute kidney failure, unspecified: Secondary | ICD-10-CM | POA: Diagnosis present

## 2018-07-15 DIAGNOSIS — K5010 Crohn's disease of large intestine without complications: Secondary | ICD-10-CM | POA: Diagnosis present

## 2018-07-15 DIAGNOSIS — E785 Hyperlipidemia, unspecified: Secondary | ICD-10-CM | POA: Diagnosis present

## 2018-07-15 DIAGNOSIS — Z20828 Contact with and (suspected) exposure to other viral communicable diseases: Secondary | ICD-10-CM | POA: Diagnosis present

## 2018-07-15 DIAGNOSIS — I6300 Cerebral infarction due to thrombosis of unspecified precerebral artery: Secondary | ICD-10-CM

## 2018-07-15 DIAGNOSIS — Z8249 Family history of ischemic heart disease and other diseases of the circulatory system: Secondary | ICD-10-CM | POA: Diagnosis not present

## 2018-07-15 DIAGNOSIS — R040 Epistaxis: Secondary | ICD-10-CM | POA: Diagnosis not present

## 2018-07-15 DIAGNOSIS — T380X5A Adverse effect of glucocorticoids and synthetic analogues, initial encounter: Secondary | ICD-10-CM | POA: Diagnosis present

## 2018-07-15 DIAGNOSIS — I639 Cerebral infarction, unspecified: Secondary | ICD-10-CM | POA: Diagnosis present

## 2018-07-15 DIAGNOSIS — G8324 Monoplegia of upper limb affecting left nondominant side: Secondary | ICD-10-CM | POA: Diagnosis present

## 2018-07-15 DIAGNOSIS — Z823 Family history of stroke: Secondary | ICD-10-CM | POA: Diagnosis not present

## 2018-07-15 DIAGNOSIS — I10 Essential (primary) hypertension: Secondary | ICD-10-CM | POA: Diagnosis present

## 2018-07-15 DIAGNOSIS — Z9049 Acquired absence of other specified parts of digestive tract: Secondary | ICD-10-CM | POA: Diagnosis not present

## 2018-07-15 DIAGNOSIS — R471 Dysarthria and anarthria: Secondary | ICD-10-CM | POA: Diagnosis present

## 2018-07-15 DIAGNOSIS — Z880 Allergy status to penicillin: Secondary | ICD-10-CM | POA: Diagnosis not present

## 2018-07-15 DIAGNOSIS — Z886 Allergy status to analgesic agent status: Secondary | ICD-10-CM | POA: Diagnosis not present

## 2018-07-15 DIAGNOSIS — Z882 Allergy status to sulfonamides status: Secondary | ICD-10-CM | POA: Diagnosis not present

## 2018-07-15 DIAGNOSIS — R29702 NIHSS score 2: Secondary | ICD-10-CM | POA: Diagnosis present

## 2018-07-15 DIAGNOSIS — E860 Dehydration: Secondary | ICD-10-CM | POA: Diagnosis present

## 2018-07-15 LAB — BASIC METABOLIC PANEL
Anion gap: 8 (ref 5–15)
BUN: 17 mg/dL (ref 6–20)
CO2: 19 mmol/L — ABNORMAL LOW (ref 22–32)
Calcium: 8.6 mg/dL — ABNORMAL LOW (ref 8.9–10.3)
Chloride: 113 mmol/L — ABNORMAL HIGH (ref 98–111)
Creatinine, Ser: 1.17 mg/dL — ABNORMAL HIGH (ref 0.44–1.00)
GFR calc Af Amer: 59 mL/min — ABNORMAL LOW (ref >60)
GFR calc non Af Amer: 51 mL/min — ABNORMAL LOW (ref >60)
Glucose, Bld: 93 mg/dL (ref 70–99)
Potassium: 3.9 mmol/L (ref 3.5–5.1)
Sodium: 140 mmol/L (ref 135–145)

## 2018-07-15 LAB — NOVEL CORONAVIRUS, NAA (HOSP ORDER, SEND-OUT TO REF LAB; TAT 18-24 HRS): SARS-CoV-2, NAA: NOT DETECTED

## 2018-07-15 LAB — HIV ANTIBODY (ROUTINE TESTING W REFLEX): HIV Screen 4th Generation wRfx: NONREACTIVE

## 2018-07-15 MED ORDER — CLOPIDOGREL BISULFATE 75 MG PO TABS: 225.0000 mg | ORAL_TABLET | Freq: Once | ORAL | Status: DC

## 2018-07-15 MED ORDER — SODIUM CHLORIDE 0.9 % IV BOLUS
1000.0000 mL | Freq: Once | INTRAVENOUS | Status: AC
  Administered 2018-07-15: 1000 mL via INTRAVENOUS

## 2018-07-15 MED ORDER — IOHEXOL 350 MG/ML SOLN
50.0000 mL | Freq: Once | INTRAVENOUS | Status: AC | PRN
  Administered 2018-07-15: 50 mL via INTRAVENOUS

## 2018-07-15 NOTE — Progress Notes (Signed)
Spoke with neurology, they recommended consulting cardiology for TEE and having a loop recorder placed to look for cardiac source of embolism and paroxysmal A. fib.  Recommended aspirin and Plavix.  Patient has allergy to aspirin so they recommended loading with Plavix 300 today then continue Plavix 75 daily.  Patient received 75 mg Plavix today, will place order for 225 mg Plavix now and continue Plavix 75 daily.  Will consult cardiology.

## 2018-07-15 NOTE — Evaluation (Signed)
Occupational Therapy Evaluation Patient Details Name: Patricia Livingston MRN: [id number] DOB: 1960-10-24 Today's Date: 07/15/2018    History of Present Illness Ms. Gloria, 58y/f presejted to ED with complaints of left arm weakness and dysarthria for several days. MRI showed acute infarctions at right frontoparietal junction as well as few in both parietal lobes. Significant medical history includes Crohn's diseas s/p colectomy and HTN.     Clinical Impression   Pt PTAL lives alone, son nearby. Pt was working and independent prior. Pt currently limited by poor fine and gross motor coordination in LUE,decreased strength in LUE and decreased ability to perform ADL with L hand deficits. Pt currently performing ADL tasks with RUE mostly and LUE assisting for stabilizing. Pt mobility with modified independence in room with fair balance. Pt would benefit from continued OT skilled services for ADL, mobility and LUE HEP. OT following acutely. Pt's L hand is in need of OP OT to continue to assess and progress HEP for LUE.     Follow Up Recommendations  Outpatient OT(neuro out-pt. pt lives in [place] wants [employer] location)    Equipment Recommendations  None recommended by OT    Recommendations for Other Services       Precautions / Restrictions Precautions Precautions: Fall Restrictions Weight Bearing Restrictions: No      Mobility Bed Mobility Overal bed mobility: Modified Independent                Transfers Overall transfer level: Modified independent               General transfer comment: no physical assist required    Balance                                           ADL either performed or assessed with clinical judgement   ADL Overall ADL's : Needs assistance/impaired Eating/Feeding: Set up;Sitting Eating/Feeding Details (indicate cue type and reason): RUE with LUE stabilizing items Grooming: Wash/dry hands;Wash/dry face;Oral care;Brushing  hair;Supervision/safety;Standing   Upper Body Bathing: Set up;Sitting   Lower Body Bathing: Minimal assistance;Sitting/lateral leans;Sit to/from stand Lower Body Bathing Details (indicate cue type and reason): LUE poor FMC unable to assist with sock donning Upper Body Dressing : Set up;Sitting   Lower Body Dressing: Minimal assistance Lower Body Dressing Details (indicate cue type and reason): LUE poor FMC unable to assist with sock donning Toilet Transfer: Supervision/safety;Ambulation;Regular Toilet;Grab bars   Toileting- Clothing Manipulation and Hygiene: Supervision/safety;Sitting/lateral lean;Sit to/from stand       Functional mobility during ADLs: Supervision/safety General ADL Comments: requires assist as LUE w/ poor FMC- able to assist with stabilizing objects, but unable to assist with sock donning     Vision Baseline Vision/History: No visual deficits Vision Assessment?: No apparent visual deficits     Perception Perception Perception Tested?: Yes Comments: Pt with WNL proprioception in space with BUEs; sensation intact. Pt able close eyes and mimic position using LUE to mimic RUE after OT positioned RUE with pt's eyes closed.   Praxis      Pertinent Vitals/Pain Pain Assessment: 0-10 Pain Score: 3  Pain Location: neck, thoracic spine and headache Pain Descriptors / Indicators: Dull;Aching Pain Intervention(s): Limited activity within patient's tolerance;Monitored during session     Hand Dominance Right   Extremity/Trunk Assessment Upper Extremity Assessment Upper Extremity Assessment: Generalized weakness;LUE deficits/detail LUE Deficits / Details: decreased strength and decreased FMC. LUE  Sensation: WNL LUE Coordination: decreased fine motor;decreased gross motor   Lower Extremity Assessment Lower Extremity Assessment: Defer to PT evaluation   Cervical / Trunk Assessment Cervical / Trunk Assessment: Normal   Communication Communication Communication:  No difficulties   Cognition Arousal/Alertness: Awake/alert Behavior During Therapy: WFL for tasks assessed/performed Overall Cognitive Status: Within Functional Limits for tasks assessed                                     General Comments  Pt education heavily on LUE HEP given handout for [facility] and pt advised to watch RUE do exercise first prior to performing with LUE. 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    Exercises     Shoulder Instructions      Home Living Family/patient expects to be discharged to:: Private residence Living Arrangements: Alone Available Help at Discharge: Family;Available PRN/intermittently Type of Home: House Home Access: Stairs to enter [employer] of Steps: 8 Entrance Stairs-Rails: None Home Layout: Two level;1/2 bath on main level Alternate Level Stairs-Number of Steps: full flight   Bathroom Shower/Tub: Teacher, early years/pre: Standard     Home Equipment: None      Lives With: Alone    Prior Functioning/Environment Level of Independence: Independent        Comments: working and driving        OT Problem List: Decreased strength;Decreased activity tolerance;Impaired balance (sitting and/or standing);Decreased coordination;Decreased safety awareness;Impaired UE functional use;Pain      OT Treatment/Interventions: Self-care/ADL training;Therapeutic exercise;Neuromuscular education;Energy conservation;DME and/or AE instruction;Therapeutic activities;Patient/family education    OT Goals(Current goals can be found in the care plan section) Acute Rehab OT Goals Patient Stated Goal: to use my L hand again OT Goal Formulation: With patient Time For Goal Achievement: 07/28/18 Potential to Achieve Goals: Good ADL Goals Pt Will Perform Lower Body Dressing: with modified independence;sitting/lateral leans;sit to/from stand Pt Will Perform Toileting - Clothing Manipulation and hygiene: with  modified independence;sitting/lateral leans;sit to/from stand Pt/caregiver will Perform Home Exercise Program: Left upper extremity;Independently;With written HEP provided Additional ADL Goal #1: Pt will complete OOB ADL with Modified independence  OT Frequency: Min 3X/week   Barriers to D/C: Decreased caregiver support  pt stating that "I have a son in the area, but I don't expect him to assist me a lot of the time."       Co-evaluation              AM-PAC OT "6 Clicks" Daily Activity     Outcome Measure Help from another person eating meals?: A Little Help from another person taking care of personal grooming?: A Little Help from another person toileting, which includes using toliet, bedpan, or urinal?: A Little Help from another person bathing (including washing, rinsing, drying)?: A Little Help from another person to put on and taking off regular upper body clothing?: A Little Help from another person to put on and taking off regular lower body clothing?: A Little 6 Click Score: 18   End of Session Nurse Communication: Mobility status  Activity Tolerance: Patient tolerated treatment well Patient left: in bed;with call bell/phone within reach  OT Visit Diagnosis: Other symptoms and signs involving the nervous system (R29.898);Muscle weakness (generalized) (M62.81)                Time: 1219-1300 OT Time Calculation (min): 41 min Charges:  OT General  Charges $OT Visit: 1 Visit OT Evaluation $OT Eval Moderate Complexity: 1 Mod OT Treatments $Self Care/Home Management : 8-22 mins $Neuromuscular Re-education: 8-22 mins  Cristi LoronAllison (Jelenek) [place] OTR/L Acute Rehabilitation Services Pager: [phone] Office: [phone]   Gunnar FusiLLISON J Donnah Levert 07/15/2018, 1:16 PM

## 2018-07-15 NOTE — Progress Notes (Signed)
   Subjective:  Patricia Livingston is a 58 y.o. F with PMH of Crohn's Disease s/p colectomy, HTN admit for  on hospital day 1  Patricia Livingston was examined and evaluated at bedside this AM. She states she feels fine and her left arm strength is improving now that she has her IV in a different location. She mentions she had good night of sleep. She expresses concerns about implantable loop recorder because she does not feel comfortable having medical device implanted under her skin. She also mentions concerns with TEE as she is afraid it may exacerbate her Crohn's Disease which may have been in remission for a long time. Discussed that we can continue the necessity of those procedures based on our findings from the transthoracic echocardiogram. Patricia Livingston expressed understanding. All other concerns addressed.  Objective:  Vital signs in last 24 hours: Vitals:   07/15/18 0337 07/15/18 0400 07/15/18 0850 07/15/18 1224  BP: 121/74 121/74 127/84 129/72  Pulse: 81 81 100 100  Resp: 16 16 20 15   Temp: 98.1 F (36.7 C) 98.1 F (36.7 C) 98.4 F (36.9 C) 98.1 F (36.7 C)  TempSrc: Oral Oral Oral Oral  SpO2: 99% 99% 98% 100%  Weight:      Height:       Physical Exam  Constitutional: She is well-developed, well-nourished, and in no distress. No distress.  Cardiovascular: Normal rate, regular rhythm, normal heart sounds and intact distal pulses.  No murmur heard. Pulmonary/Chest: Effort normal and breath sounds normal. She has no wheezes. She has no rales.  Musculoskeletal: Normal range of motion.        General: No edema.  Neurological:  Neurologic exam: Mental status: A&Ox3 Cranial Nerves: II: PERRL III, IV, VI: Extra-occular motions intact bilaterally V, VII: Face symmetric, sensation intact in all 3 divisions  VIII: hearing normal to rubbing fingers bilaterally  IX, X: palate rises symmetrically XI: Head turn and shoulder  shrug normal bilaterally  XII: tongue midline  Motor: Strength 4/5 on LUE, 5/5 on RUE, 5/5 on RLE, 5/5 on LUE Sensory: Light touch intact and symmetric bilaterally  Psychiatric: Normal mood and affect   Assessment/Plan:  Active Problems:   CVA (cerebral vascular accident) ([place])  Patricia Livingston is a 58 yo F w/ PMH of Crohn's disease s/p colectomy and HTN presenting with LUE weakness 2/2 multi-focal stroke. No evidence of hyperlipidemia or diabetes on screening labs. CTA head & neck not showing large vessel occlusion. No irregular rhythm captured on telemetry. Currently awaiting echocardiogram. Most likely will need event monitor and possible TEE prior to discharge.  LUE weakness 2/2 multifocal stroke MRI head: acute infarctions in right frontoparietal junction + both parietal lobes. CTA head/neck: No large vessel occlusion. LDL 103 w/ <5% 10-year ASCVD event risk. Hgb a1c of 5.1 - Appreciate neurology recs - Allow for permissive HTN in the setting of CVA (systolic <161 and diastolic <096) - C/w Clopidogrel 75mg  daily, atorvastatin 80mg  - F/u TTE - Telemonitoring  - SLP eval - PT/OT  AKI vs CKD Unclear baseline, creatinine improved from 1.51->1.17 after fluid resuscitation. Will provide supplemental fluid support in setting of contrast study this am. - NaCl bolus 1000cc - Trend renal function - Avoid nephrotoxic meds  DVT prophx: Lovenox Diet: Regular Bowel: Senokot Code: Full  Dispo: Anticipated discharge in approximately 1-2 day(s).   Patricia Anis, MD 07/15/2018, 12:34 PM Pager: [phone]

## 2018-07-15 NOTE — Progress Notes (Signed)
STROKE TEAM PROGRESS NOTE   HISTORY OF PRESENT ILLNESS (per Dr Rory Percy) Patricia Livingston is a 58 y.o. female with past medical history of hypertension and Crohn's colitis-on no immune suppression other than prednisone 5 mg daily. 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  On Sunday morning 07/09/2018 she was up and started to fall asleep at around 6.  She noted that her head was nodding off and falling forward then she suddenly felt a "snap" and right after felt as though her left greater then right hand and arm were slightly numb. This subsided slightly and she did not think much of it.  Her right arm was better so she did not seek any medical attention. But left-sided weakness persisted and today which is approximately 4/5 days later she felt great her left arm numbness and significant weakness in left hand. For this reason she came in to be evaluated for stroke.  She states that she does not take aspirin as in the past she felt as though it caused her to have a rash. Patient denies smoking, alcohol abuse or illicit drug use.  Patient denies any other symptoms other than the left arm and hand weakness. Hand greater than proximal arm. And some left leg weakness.  ED course: CT head, CT cervical spine, MRA, MRI of brain  LKW: 07/09/2018 tpa given?: no, out of the window Premorbid modified Rankin scale (mRS): 0 NIH stroke scale of 2   SUBJECTIVE (INTERVAL HISTORY) I have personally reviewed history of presenting illness in detail with the patient and reviewed electronic medical records and personally reviewed imaging films in PACS.  She states she still has persistent left hand weakness and numbness.  She has no prior history of strokes.  She denies any history of atrial fibrillation, palpitations, syncope or cardiac disease.    OBJECTIVE Vitals:   07/15/18 0337 07/15/18 0400 07/15/18 0850 07/15/18 1224  BP: 121/74 121/74 127/84 129/72  Pulse: 81 81 100 100  Resp: 16 16 20  15   Temp: 98.1 F (36.7 C) 98.1 F (36.7 C) 98.4 F (36.9 C) 98.1 F (36.7 C)  TempSrc: Oral Oral Oral Oral  SpO2: 99% 99% 98% 100%  Weight:      Height:        CBC:  Recent Labs  Lab 07/14/18 1110  WBC 19.1*  NEUTROABS 17.0*  HGB 12.5  HCT 38.7  MCV 90.6  PLT 814    Basic Metabolic Panel:  Recent Labs  Lab 07/14/18 1110 07/15/18 0704  NA 135 140  K 4.4 3.9  CL 109 113*  CO2 15* 19*  GLUCOSE 94 93  BUN 24* 17  CREATININE 1.51* 1.17*  CALCIUM 8.7* 8.6*    Lipid Panel:     Component Value Date/Time   CHOL 190 07/14/2018 1730   TRIG 144 07/14/2018 1730   HDL 58 07/14/2018 1730   CHOLHDL 3.3 07/14/2018 1730   VLDL 29 07/14/2018 1730   LDLCALC 103 (H) 07/14/2018 1730   HgbA1c:  Lab Results  Component Value Date   HGBA1C 5.1 07/14/2018   Urine Drug Screen: No results found for: LABOPIA, COCAINSCRNUR, LABBENZ, AMPHETMU, THCU, LABBARB  Alcohol Level No results found for: ETH  IMAGING  Ct Angio Head W Or Wo Contrast Ct Angio Neck W Or Wo Contrast 07/15/2018 IMPRESSION:  1. 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 This is more prominent on the left than the right.  No dissection or aneurysm. There is minimal atherosclerotic disease of the carotid bifurcation bilaterally  2. Negative for intracranial large vessel occlusion. Presumed embolus to the right frontal parietal lobe not visualized.   Ct Head Wo Contrast 07/14/2018 IMPRESSION:  1. There is subtle hypodensity of the right frontoparietal junction deep white matter (series 4, image 20, 19), concerning for acute to subacute infarction. MRI may be used to further evaluate for acute diffusion restriction if desired.  2. No fracture or static subluxation of the cervical spine. Minimal multilevel disc space height loss and osteophytosis. MRI may be used to more sensitively evaluate for cervical disc and neural foraminal pathology if desired.    Ct Cervical  Spine Wo Contrast 07/14/2018 IMPRESSION:  1. There is subtle hypodensity of the right frontoparietal junction deep white matter (series 4, image 20, 19), concerning for acute to subacute infarction. MRI may be used to further evaluate for acute diffusion restriction if desired. 2. No fracture or static subluxation of the cervical spine. Minimal multilevel disc space height loss and osteophytosis. MRI may be used to more sensitively evaluate for cervical disc and neural foraminal pathology if desired.   Mr Brain 12Wo Contrast Mr Angio Head Wo Contrast 07/14/2018 IMPRESSION:  Patchy acute infarctions at the right frontoparietal junction involving both the precentral and postcentral gyri. Few other punctate acute infarctions in both parietal lobes. 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 Background pattern of chronic small vessel ischemic changes of the cerebellum and hemispheric white matter. Negative intracranial MR angiography of the large and medium size vessels. Possible fibromuscular change of the upper cervical internal carotid arteries. Small right mastoid effusion.    Transthoracic Echocardiogram - pending   Bilateral Carotid Dopplers - pending   EKG - SR rate 63 BPM. (See cardiology reading for complete details)   PHYSICAL EXAM Blood pressure 129/72, pulse 100, temperature 98.1 F (36.7 C), temperature source Oral, resp. rate 15, height 5\' 6"  (1.676 m), weight 74.4 kg, SpO2 100 %. Pleasant middle-aged Caucasian lady currently not in distress. . Afebrile. Head is nontraumatic. Neck is supple without bruit.    Cardiac exam no murmur or gallop. Lungs are clear to auscultation. Distal pulses are well felt. Neurological Exam ;  Awake  Alert oriented x 3. Normal speech and language.eye movements full without nystagmus.fundi were not visualized. Vision acuity and fields appear normal. Hearing is normal. Palatal movements are normal. Face  symmetric. Tongue midline. Normal strength, tone, reflexes and coordination significant weakness of left hand grip and intrinsic hand muscles.  Orbits right over left upper extremity.  Fine finger movements are diminished on the left.. Normal sensation. Gait deferred.       ASSESSMENT/PLAN Ms. Breck CoonsDiane Cahalan is a 58 y.o. female with history of hypertension and crohn's colitis (prednisone 5 mg daily) presenting with left sided weakness/numbness. 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  Resultant left hand weakness   CT head - concerning for right frontal parietal infarct.  MRI head - Patchy acute infarctions at the right frontoparietal junction involving both the precentral and postcentral gyri. Few other punctate acute infarctions in both parietal lobes.  MRA head - no significant vessel occlusion. Possible fibromuscular change of the upper cervical internal carotid arteries.  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 Permissive hypertension (OK if < 220/120) but gradually normalize in 5-7 days . 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   Hospital day # 0 She presented with bilateral paresthesias and subsequently left hand weakness with MRI scan showing right MCA branch greater than left MCA branch infarcts likely of embolic etiology.  Continue ongoing stroke work-up and check echocardiogram Doppler studies.  She will likely need TEE and prolonged cardiac monitoring to look for cardiac source of embolism and paroxysmal A. fib.  Recommend aspirin and Plavix for 3 weeks followed by aspirin alone.  Aggressive risk factor modification.  Long discussion with patient and answered questions.  Greater than 50% time during this 35-minute visit was spent in counseling and coordination of care about her embolic strokes and discussion about stroke evaluation and treatment and answering questions Delia HeadyPramod Ambar Raphael, MD Medical Director [facility] Pager: [phone] 07/15/2018 2:03 PM To contact Stroke Continuity provider, please refer to [email or web address]. After hours, contact General Neurology

## 2018-07-15 NOTE — Evaluation (Signed)
Speech Language Pathology Evaluation Patient Details Name: Patricia Livingston MRN: [id number] DOB: 09-04-1960 Today's Date: 07/15/2018 Time: 6962-9528 SLP Time Calculation (min) (ACUTE ONLY): 21 min  Problem List:  Patient Active Problem List   Diagnosis Date Noted  . CVA (cerebral vascular accident) ([place]) 07/14/2018   Past Medical History:  Past Medical History:  Diagnosis Date  . Crohn's colitis ([place])   . Hypertension    Past Surgical History: History reviewed. No pertinent surgical history. HPI:  Patricia Livingston, 58y/f presejted to ED with complaints of left arm weakness and dysarthria for several days. MRI showed acute infarctions at right frontoparietal junction as well as few in both parietal lobes. Significant medical history includes Crohn's diseas s/p colectomy and HTN.    Assessment / Plan / Recommendation Clinical Impression  Patricia Livingston, 58y/f presented to ED with complaints of left arm weakness and dysarthria for several days. She reports no observed changes in her cognition and language. Her speech was dysarthric for several days, especially when she was tired, however has since resolved.  MOCA was administered with a score of 29/30 (With 26 or above being [facility]). She is able to communicate at the conversational level and recall all the events leading to her hospitalization and current situation. No further concerns for her language and cognition. ST to sign off at this time.     SLP Assessment  SLP Recommendation/Assessment: Patient does not need any further Speech Lanaguage Pathology Services SLP Visit Diagnosis: Cognitive communication deficit (R41.841)    Follow Up Recommendations  None               SLP Evaluation Cognition  Overall Cognitive Status: Within Functional Limits for tasks assessed Arousal/Alertness: Awake/alert Orientation Level: Oriented X4 Attention: Focused Focused Attention: Appears intact Memory: Appears intact Awareness: Appears intact Problem  Solving: Appears intact Executive Function: Reasoning Reasoning: Appears intact Safety/Judgment: Appears intact       Comprehension  Auditory Comprehension Overall Auditory Comprehension: Appears within functional limits for tasks assessed Yes/No Questions: Within Functional Limits Commands: Within Functional Limits Conversation: Complex Visual Recognition/Discrimination Discrimination: Within Function Limits    Expression Expression Primary Mode of Expression: Verbal Verbal Expression Overall Verbal Expression: Appears within functional limits for tasks assessed Initiation: No impairment Level of Generative/Spontaneous Verbalization: Conversation Repetition: No impairment Naming: No impairment Pragmatics: No impairment Written Expression Dominant Hand: Right Written Expression: Within Functional Limits   Oral / Motor  Oral Motor/Sensory Function Overall Oral Motor/Sensory Function: Within functional limits Motor Speech Overall Motor Speech: Appears within functional limits for tasks assessed Respiration: Within functional limits Phonation: Normal Resonance: Within functional limits Articulation: Within functional limitis Intelligibility: Intelligible Motor Planning: Witnin functional limits Motor Speech Errors: Not applicable   GO                    Charlynne Cousins Kaylinn Dedic, MA, CCC-SLP 07/15/2018 9:38 AM

## 2018-07-15 NOTE — Progress Notes (Signed)
Date: 07/15/2018  Patient name: Lynna Carlsson  Medical record number: [id number]  Date of birth: Oct 01, 1960   I have seen and evaluated Breck Coons and discussed their care with the Residency Team.  In brief, patient is a 58 year old female with a past medical history of Crohn's disease status post colectomy and hypertension who presented to the ED with worsening left arm weakness x1 day. Patient was in her usual state of health till 5 to 6 days ago when she fell asleep sitting upright and developed pain in her neck associated with numbness in her left arm.  The pain subsided but patient noted left arm weakness and difficulty with coordination.  The weakness persisted and the day prior to admission she noted worsening weakness in that arm and associated dysarthria and difficulty ambulating.  Patient came to the ED for further evaluation.  No fevers or chills, no chest pain, no shortness of breath, no palpitations, no lightheadedness, no syncope, no nausea or vomiting, no diarrhea, no abdominal pain.  Patient is found to have acute infarctions in the right frontoparietal junction as well as in both parietal lobes on MRI/MRA in the ED.  Today patient complains of persistent weakness in her left upper extremity but feels well otherwise.  PMHx, Fam Hx, and/or Soc Hx : As per resident admit note  Vitals:   07/15/18 0850 07/15/18 1224  BP: 127/84 129/72  Pulse: 100 100  Resp: 20 15  Temp: 98.4 F (36.9 C) 98.1 F (36.7 C)  SpO2: 98% 100%   Physical Exam  Constitutional: She is oriented to person, place, and time and well-developed, well-nourished, and in no distress.  HENT:  Head: Normocephalic and atraumatic.  Eyes: Right eye exhibits no discharge. Left eye exhibits no discharge.  Cardiovascular: Normal rate, regular rhythm and normal heart sounds.  Pulmonary/Chest: Effort normal and breath sounds normal. No respiratory distress. She has no wheezes.  Abdominal: Soft. Bowel sounds are  normal. She exhibits no distension. There is no abdominal tenderness.  Musculoskeletal:        General: No tenderness or edema.  Neurological: She is alert and oriented to person, place, and time.  Left hand grip is 4 out of 5 compared to, remaining extremities are 5 out of 5, sensation is intact bilaterally and equal  Skin: Skin is warm and dry.  Psychiatric: Mood and affect normal.    Assessment and Plan: I have seen and evaluated the patient as outlined above. I agree with the formulated Assessment and Plan as detailed in the residents' note, with the following changes:   1.  Acute CVA: Patient presented to the ED with worsening left arm weakness, dysarthria as well as difficulty ambulating and was found to have patchy acute infarctions of the right frontoparietal junction as well as punctate acute infarctions in both parietal lobes consistent with acute CVA. -Neuro follow-up and recommendations appreciated -CTA head and neck with no large vessel occlusion noted -Continue with Plavix 75 mg daily as well as atorvastatin 80 mg -Given the findings of acute infarctions in both lobes I suspect that this is likely embolic in nature.  Patient will need a 2D echo as well as possible TEE to look for a clot. -If this work-up is negative patient will likely need a loop recorder to look for occult A. Fib -PT/OT to work with the patient -Continue with telemetry monitoring for now -Allow for permissive hypertension in the setting of CVA -Patient also noted to have mild AKI on admission with  creatinine up to 1.5.  Patient's creatinine is improved to 1.17 after fluid resuscitation.  We will continue with normal saline for now.  Repeat BMP in a.m.  Aldine Contes, MD 6/20/20202:11 PM

## 2018-07-15 NOTE — Evaluation (Signed)
Physical Therapy Evaluation Patient Details Name: Patricia Livingston MRN: [id number] DOB: March 16, 1960 Today's Date: 07/15/2018   History of Present Illness  Ms. Wileman, 58y/f presejted to ED with complaints of left arm weakness and dysarthria for several days. MRI showed acute infarctions at right frontoparietal junction as well as few in both parietal lobes. Significant medical history includes Crohn's diseas s/p colectomy and HTN.    Clinical Impression  Patient is independent with all gross mobility tasks. She was able to walk and do stairs without assist. She had no syncope. She does have significant left upper extremity limitations but that will be addressed with occupational therapy. She has no need for further skilled physical therapy.     Follow Up Recommendations No PT follow up    Equipment Recommendations  None recommended by PT    Recommendations for Other Services       Precautions / Restrictions Precautions Precautions: Fall Restrictions Weight Bearing Restrictions: No      Mobility  Bed Mobility Overal bed mobility: Modified Independent             General bed mobility comments: no assit. patient found edge of the bed.   Transfers Overall transfer level: Modified independent               General transfer comment: stood without difficulty   Ambulation/Gait Ambulation/Gait assistance: Modified independent (Device/Increase time) Gait Distance (Feet): 75 Feet Assistive device: None   Gait velocity: normal    General Gait Details: ambaulted with head turns and nodding without syncope   Stairs Stairs: Yes Stairs assistance: Independent Stair Management: One rail Right Number of Stairs: 8 General stair comments: no assitance required   Wheelchair Mobility    Modified Rankin (Stroke Patients Only) Modified Rankin (Stroke Patients Only) Pre-Morbid Rankin Score: No symptoms Modified Rankin: Moderate disability     Balance Overall balance  assessment: Independent Sitting-balance support: No upper extremity supported;Feet supported Sitting balance-Leahy Scale: Good     Standing balance support: No upper extremity supported Standing balance-Leahy Scale: Good Standing balance comment: no LOB                High Level Balance Comments: perfromed tandem astance, narrow base EO and EC and coordinated stepping with no LOB              Pertinent Vitals/Pain Pain Assessment: 0-10 Pain Score: 3  Pain Location: neck, thoracic spine and headache Pain Descriptors / Indicators: Dull;Aching Pain Intervention(s): Limited activity within patient's tolerance    Home Living Family/patient expects to be discharged to:: Private residence Living Arrangements: Alone Available Help at Discharge: Family;Available PRN/intermittently Type of Home: House Home Access: Stairs to enter Entrance Stairs-Rails: None Entrance Stairs-Number of Steps: 8 Home Layout: Two level;1/2 bath on main level Home Equipment: None      Prior Function Level of Independence: Independent         Comments: working and driving     Hand Dominance   Dominant Hand: Right    Extremity/Trunk Assessment   Upper Extremity Assessment Upper Extremity Assessment: Defer to OT evaluation LUE Deficits / Details: decreased strength and decreased FMC. LUE Sensation: WNL LUE Coordination: decreased fine motor;decreased gross motor    Lower Extremity Assessment Lower Extremity Assessment: Overall WFL for tasks assessed    Cervical / Trunk Assessment Cervical / Trunk Assessment: Normal  Communication   Communication: No difficulties  Cognition Arousal/Alertness: Awake/alert Behavior During Therapy: WFL for tasks assessed/performed Overall Cognitive Status: Within Functional Limits for  tasks assessed                                        General Comments General comments (skin integrity, edema, etc.): Pt education heavily on LUE  HEP given handout for [facility] and pt advised to watch RUE do exercise first prior to performing with LUE. 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    Exercises     Assessment/Plan    PT Assessment Patent does not need any further PT services  PT Problem List         PT Treatment Interventions      PT Goals (Current goals can be found in the Care Plan section)  Acute Rehab PT Goals Patient Stated Goal: to use my L hand again PT Goal Formulation: With patient Time For Goal Achievement: 07/22/18 Potential to Achieve Goals: Good    Frequency     Barriers to discharge        Co-evaluation               AM-PAC PT "6 Clicks" Mobility  Outcome Measure Help needed turning from your back to your side while in a flat bed without using bedrails?: None Help needed moving from lying on your back to sitting on the side of a flat bed without using bedrails?: None Help needed moving to and from a bed to a chair (including a wheelchair)?: None Help needed standing up from a chair using your arms (e.g., wheelchair or bedside chair)?: None Help needed to walk in hospital room?: None Help needed climbing 3-5 steps with a railing? : None 6 Click Score: 24    End of Session Equipment Utilized During Treatment: Gait belt Activity Tolerance: Patient tolerated treatment well Patient left: in bed;with call bell/phone within reach(edge of bed ) Nurse Communication: Mobility status PT Visit Diagnosis: Other abnormalities of gait and mobility (R26.89)    Time: 5284-1324 PT Time Calculation (min) (ACUTE ONLY): 16 min   Charges:   PT Evaluation $PT Eval Low Complexity: 1 Low           Carney Living PT DPT  07/15/2018, 2:01 PM

## 2018-07-15 NOTE — Plan of Care (Signed)
Progressing towards goals

## 2018-07-16 ENCOUNTER — Inpatient Hospital Stay (HOSPITAL_COMMUNITY): Payer: PRIVATE HEALTH INSURANCE

## 2018-07-16 DIAGNOSIS — I639 Cerebral infarction, unspecified: Principal | ICD-10-CM

## 2018-07-16 LAB — BASIC METABOLIC PANEL
Anion gap: 9 (ref 5–15)
BUN: 16 mg/dL (ref 6–20)
CO2: 19 mmol/L — ABNORMAL LOW (ref 22–32)
Calcium: 8.7 mg/dL — ABNORMAL LOW (ref 8.9–10.3)
Chloride: 112 mmol/L — ABNORMAL HIGH (ref 98–111)
Creatinine, Ser: 1.19 mg/dL — ABNORMAL HIGH (ref 0.44–1.00)
GFR calc Af Amer: 57 mL/min — ABNORMAL LOW (ref >60)
GFR calc non Af Amer: 50 mL/min — ABNORMAL LOW (ref >60)
Glucose, Bld: 145 mg/dL — ABNORMAL HIGH (ref 70–99)
Potassium: 4 mmol/L (ref 3.5–5.1)
Sodium: 140 mmol/L (ref 135–145)

## 2018-07-16 LAB — ECHOCARDIOGRAM COMPLETE
Height: 66 in
Weight: 2624 oz

## 2018-07-16 MED ORDER — CLOPIDOGREL BISULFATE 75 MG PO TABS: 75.0000 mg | ORAL_TABLET | Freq: Every day | ORAL | 1 refills | Status: AC

## 2018-07-16 MED ORDER — ATORVASTATIN CALCIUM 80 MG PO TABS: 80.0000 mg | ORAL_TABLET | Freq: Every day | ORAL | 1 refills | Status: AC

## 2018-07-16 NOTE — Progress Notes (Signed)
STROKE TEAM PROGRESS NOTE     SUBJECTIVE (INTERVAL HISTORY) Patient is neurologically stable.  She has no complaints.  She has refused to get TEE and loop recorder inserted and wants to go home after echocardiogram today    OBJECTIVE Vitals:   07/15/18 1550 07/15/18 1954 07/16/18 0417 07/16/18 1100  BP: 126/68 137/82 (!) 146/76 (!) 143/85  Pulse: 81 77 70 73  Resp: 15 18 18 18   Temp: 98.6 F (37 C) 98 F (36.7 C) 97.7 F (36.5 C) 97.6 F (36.4 C)  TempSrc: Oral Oral Oral Axillary  SpO2: 99% 99% 98% 100%  Weight:      Height:        CBC:  Recent Labs  Lab 07/14/18 1110  WBC 19.1*  NEUTROABS 17.0*  HGB 12.5  HCT 38.7  MCV 90.6  PLT 465    Basic Metabolic Panel:  Recent Labs  Lab 07/15/18 0704 07/16/18 1248  NA 140 140  K 3.9 4.0  CL 113* 112*  CO2 19* 19*  GLUCOSE 93 145*  BUN 17 16  CREATININE 1.17* 1.19*  CALCIUM 8.6* 8.7*    Lipid Panel:     Component Value Date/Time   CHOL 190 07/14/2018 1730   TRIG 144 07/14/2018 1730   HDL 58 07/14/2018 1730   CHOLHDL 3.3 07/14/2018 1730   VLDL 29 07/14/2018 1730   LDLCALC 103 (H) 07/14/2018 1730   HgbA1c:  Lab Results  Component Value Date   HGBA1C 5.1 07/14/2018   Urine Drug Screen: No results found for: LABOPIA, COCAINSCRNUR, LABBENZ, AMPHETMU, THCU, LABBARB  Alcohol Level No results found for: ETH  IMAGING  Ct Angio Head W Or Wo Contrast Ct Angio Neck W Or Wo Contrast 07/15/2018 IMPRESSION:  1. 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 This is more prominent on the left than the right. No dissection or aneurysm. There is minimal atherosclerotic disease of the carotid bifurcation bilaterally  2. Negative for intracranial large vessel occlusion. Presumed embolus to the right frontal parietal lobe not visualized.   Ct Head Wo Contrast 07/14/2018 IMPRESSION:  1. There is subtle hypodensity of the right frontoparietal junction deep white matter  (series 4, image 20, 19), concerning for acute to subacute infarction. MRI may be used to further evaluate for acute diffusion restriction if desired.  2. No fracture or static subluxation of the cervical spine. Minimal multilevel disc space height loss and osteophytosis. MRI may be used to more sensitively evaluate for cervical disc and neural foraminal pathology if desired.    Ct Cervical Spine Wo Contrast 07/14/2018 IMPRESSION:  1. There is subtle hypodensity of the right frontoparietal junction deep white matter (series 4, image 20, 19), concerning for acute to subacute infarction. MRI may be used to further evaluate for acute diffusion restriction if desired. 2. No fracture or static subluxation of the cervical spine. Minimal multilevel disc space height loss and osteophytosis. MRI may be used to more sensitively evaluate for cervical disc and neural foraminal pathology if desired.   Mr Brain 16 Contrast Mr Angio Head Wo Contrast 07/14/2018 IMPRESSION:  Patchy acute infarctions at the right frontoparietal junction involving both the precentral and postcentral gyri. Few other punctate acute infarctions in both parietal lobes. 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 Background pattern of chronic small vessel ischemic changes of the cerebellum and hemispheric white matter. Negative intracranial MR angiography of the large and medium size vessels. Possible fibromuscular  change of the upper cervical internal carotid arteries. Small right mastoid effusion.    Transthoracic Echocardiogram -normal ejection fraction 55 to 60%.  No significant wall motion abnormalities.   Bilateral Carotid Dopplers -not needed.  See CT angios results.  EKG - SR rate 63 BPM. (See cardiology reading for complete details)   PHYSICAL EXAM Blood pressure (!) 143/85, pulse 73, temperature 97.6 F (36.4 C), temperature source Axillary, resp. rate 18, height 5\' 6"   (1.676 m), weight 74.4 kg, SpO2 100 %. Pleasant middle-aged Caucasian lady currently not in distress. . Afebrile. Head is nontraumatic. Neck is supple without bruit.    Cardiac exam no murmur or gallop. Lungs are clear to auscultation. Distal pulses are well felt. Neurological Exam ;  Awake  Alert oriented x 3. Normal speech and language.eye movements full without nystagmus.fundi were not visualized. Vision acuity and fields appear normal. Hearing is normal. Palatal movements are normal. Face symmetric. Tongue midline. Normal strength, tone, reflexes and coordination significant weakness of left hand grip and intrinsic hand muscles.  Orbits right over left upper extremity.  Fine finger movements are diminished on the left.. Normal sensation. Gait deferred.       ASSESSMENT/PLAN Ms. [name] is a 58 y.o. female with history of hypertension and crohn's colitis (prednisone 5 mg daily) presenting with left sided weakness/numbness. 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  Likely cryptogenic  Resultant left hand weakness   CT head - concerning for right frontal parietal infarct.  MRI head - Patchy acute infarctions at the right frontoparietal junction involving both the precentral and postcentral gyri. Few other punctate acute infarctions in both parietal lobes.  MRA head - no significant vessel occlusion. Possible fibromuscular change of the upper cervical internal carotid arteries.  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 Permissive hypertension (OK if < 220/120) but gradually normalize in 5-7 days . 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  Continue Plavix for stroke prevention.  And Lipitor for hyperlipidemia. follow-up as an outpatient in the stroke clinic in 6 weeks.  Stroke team will sign off.  Kindly call for questions.   Hospital day # 1   Delia HeadyPramod Yamel Bale, MD Medical Director [facility] Pager: [phone] 07/16/2018 2:38 PM To contact Stroke Continuity provider, please refer to [email or web address]. After hours, contact General Neurology

## 2018-07-16 NOTE — Progress Notes (Signed)
  Echocardiogram 2D Echocardiogram has been performed.  Patricia Livingston 07/16/2018, 10:50 AM

## 2018-07-16 NOTE — Progress Notes (Signed)
Patient ready for discharge to home; discharge instructions given and reviewed; Rx's sent electronically. Patient charged on [facility] and given her lipitor 1800 dose prior to discharge; her pharmacy is closed til tomorrow. Education completed on medications and stroke prevention; educational booklet given to patient.  Patient dressed and ready to discharge; discharged out via wheelchair; accompanied home by her family member.  Patient will follow up with her primary MD about the 30 day heart monitor.  Follow up with neurology listed on her AVS.

## 2018-07-16 NOTE — Progress Notes (Addendum)
Subjective: Patient had several questions today including attempting to assess the absolute need for the TEE.  I advised her that we would first obtain a TTE and depending on findings we can further discuss the need for the TEE at that time.  She again reiterates that she is resistant to the idea of doing a TEE regardless of the finding to the TTE and all but refused this test.  I asked her to reconsider, which she will do, but feels the TEE is unnecessary at this did not significantly change her treatment course. Patient noted that she refused the Plavix load given her nosebleed overnight and her history of Crohn's disease.  As we are 1 week out from her initial symptoms we do not feel that this is strongly indicated at this time based on my initial discussion with neurology. We discussed the need for anticoagulation given the patient's high CHA2DS2-VASc score with the high likely hood of this being an embolic stroke from a-fib to which she stated that this would likely be acceptable. All other questions discussed and answered.  Objective:  Vital signs in last 24 hours: Vitals:   07/15/18 1224 07/15/18 1550 07/15/18 1954 07/16/18 0417  BP: 129/72 126/68 137/82 (!) 146/76  Pulse: 100 81 77 70  Resp: 15 15 18 18   Temp: 98.1 F (36.7 C) 98.6 F (37 C) 98 F (36.7 C) 97.7 F (36.5 C)  TempSrc: Oral Oral Oral Oral  SpO2: 100% 99% 99% 98%  Weight:      Height:       General: A/O x4, in no acute distress, afebrile, nondiaphoretic HEENT: PEERL, EMO intact Cardio: RRR, no mrg's  Pulmonary: CTA bilaterally, no wheezing or crackles  Abdomen: Bowel sounds normal, soft, nontender  MSK: BLE nontender Neuro: Alert, CNII-XII grossly intact, conversational, strength 5/5 in the upper and lower extremities bilaterally with exception of the drip grip strength in the left upper extremity and finger to nose slightly less accurate in the left, normal gait Psych: Appropriate affect, not depressed in  appearance, engages well  Assessment/Plan:  Active Problems:   CVA (cerebral vascular accident) [facility])  Patient encounter summary: Ms. Luedke is a 58 year old female with past medical history significant for Crohn's disease status post partial colectomy now stable on long-term prednisone therapy who presents with left upper extremity weakness and found to have multifocal infarcts on MRI.  Thorough evaluation was completed with lipid panel, A1c, CTA head and neck, MRI, and an echocardiogram ordered.  Risk factors thus far appear to be minimal increasing need for adequate cardiac valvular evaluation, may pursue TEE if TTE insufficient.  A/P: CVA: MRI of the head revealed acute infarcts in the right frontoparietal junction.  CT Angie of the head neck did not demonstrate large vessel occlusion.  Patient's 10-year ASCVD risk is less than 5%.  Patient's CHA2DS2-VASc score relatively high at 4 although no confirmed A-fib  She will likely require anticoagulation if agreeable. Mild persistent ataxia of the LUE. - Neurology now recommending Plavix load as patient has aspirin allergy but patient refusing - Continue Plavix 75 mg daily, loading dose canceled - Continue atorvastatin 80 mg daily - Follow-up TTE, consider cardiology consult for TEE if indicated and patient agreeable - Continued telemetry monitoring - OT recommending outpatient OT - No PT follow-up recommended  AKI: Renal function improved prior day, no BMP completed today. Will await results and follow-up as indicated.  Dispo: Anticipated discharge in approximately 0-3 day(s).   Kathi Ludwig, MD 07/16/2018,  7:22 AM Pager: Pager# [phone]

## 2018-07-16 NOTE — Progress Notes (Signed)
Occupational Therapy Treatment Patient Details Name: Patricia CoonsDiane Livingston MRN: [id number] DOB: March 18, 1960 Today's Date: 07/16/2018    History of present illness Patricia Livingston, 58y/f presejted to ED with complaints of left arm weakness and dysarthria for several days. MRI showed acute infarctions at right frontoparietal junction as well as few in both parietal lobes. Significant medical history includes Crohn's diseas s/p colectomy and HTN.     OT comments  Pt showering in room with supervisionA as needed. Pt donning/doffing clothes with modified independence. Pt able to catch self with LOB episodes using grab bars as pt undershooting to grab items with LUE. Pt verbally agrees efficiency with LUE HEP and added stress ball to routine. Pt with only focal deficits in LUE for fine motor coordination and grip/pinch. Pt agreeable to OP therapy. Pt states "I know people at [facility] and I want to go there for OP OT." Pt continues to benefit from continued OT for OT skilled services for ADL and HEP progression. Pt using LUE more and able to don towel on head to dry hair with LUE doing most of the work. OT following acutely.     Follow Up Recommendations  Outpatient OT(wants [facility] in [place])    Equipment Recommendations  None recommended by OT    Recommendations for Other Services      Precautions / Restrictions Precautions Precautions: Fall Restrictions Weight Bearing Restrictions: No       Mobility Bed Mobility Overal bed mobility: Modified Independent                Transfers Overall transfer level: Modified independent                    Balance Overall balance assessment: Independent Sitting-balance support: No upper extremity supported;Feet supported Sitting balance-Leahy Scale: Good     Standing balance support: No upper extremity supported Standing balance-Leahy Scale: Good Standing balance comment: no LOB                High Level Balance  Comments: perfromed tandem astance, narrow base EO and EC and coordinated stepping with no LOB            ADL either performed or assessed with clinical judgement   ADL Overall ADL's : Needs assistance/impaired Eating/Feeding: Set up;Sitting Eating/Feeding Details (indicate cue type and reason): RUE with LUE stabilizing items Grooming: Wash/dry hands;Wash/dry face;Oral care;Brushing hair;Supervision/safety;Standing   Upper Body Bathing: Supervision/ safety;Standing   Lower Body Bathing: Supervison/ safety;Sit to/from stand   Upper Body Dressing : Supervision/safety;Sitting   Lower Body Dressing: Supervision/safety;Sitting/lateral leans;Sit to/from stand   Toilet Transfer: Supervision/safety;Regular Toilet;Grab bars   Toileting- ArchitectClothing Manipulation and Hygiene: Supervision/safety;Sitting/lateral lean;Sit to/from stand       Functional mobility during ADLs: Supervision/safety General ADL Comments: requires assist as LUE w/ poor FMC- able to assist with stabilizing objects, but unable to assist with sock donning     Vision   Vision Assessment?: No apparent visual deficits   Perception     Praxis      Cognition Arousal/Alertness: Awake/alert Behavior During Therapy: WFL for tasks assessed/performed Overall Cognitive Status: Within Functional Limits for tasks assessed                                          Exercises     Shoulder Instructions       General Comments  Added stress ball to HEP. Pt reviewed and verball went over HEP for LUE as pt already has performed 3x today    Pertinent Vitals/ Pain       Pain Assessment: 0-10 Pain Score: 3  Pain Location: neck, thoracic spine and headache Pain Descriptors / Indicators: Dull;Aching Pain Intervention(s): Monitored during session  Home Living                                          Prior Functioning/Environment              Frequency  Min 3X/week         Progress Toward Goals  OT Goals(current goals can now be found in the care plan section)  Progress towards OT goals: Progressing toward goals  Acute Rehab OT Goals Patient Stated Goal: to use my L hand again OT Goal Formulation: With patient Time For Goal Achievement: 07/28/18 Potential to Achieve Goals: Good ADL Goals Pt Will Perform Lower Body Dressing: with modified independence;sitting/lateral leans;sit to/from stand Pt Will Perform Toileting - Clothing Manipulation and hygiene: with modified independence;sitting/lateral leans;sit to/from stand Pt/caregiver will Perform Home Exercise Program: Left upper extremity;Independently;With written HEP provided Additional ADL Goal #1: Pt will complete OOB ADL with Modified independence  Plan Discharge plan remains appropriate    Co-evaluation                 AM-PAC OT "6 Clicks" Daily Activity     Outcome Measure   Help from another person eating meals?: None Help from another person taking care of personal grooming?: None Help from another person toileting, which includes using toliet, bedpan, or urinal?: None Help from another person bathing (including washing, rinsing, drying)?: None Help from another person to put on and taking off regular upper body clothing?: None Help from another person to put on and taking off regular lower body clothing?: None 6 Click Score: 24    End of Session    OT Visit Diagnosis: Other symptoms and signs involving the nervous system (R29.898);Muscle weakness (generalized) (M62.81)   Activity Tolerance Patient tolerated treatment well   Patient Left in bed;with call bell/phone within reach   Nurse Communication Mobility status        Time: 1250-1320 OT Time Calculation (min): 30 min  Charges: OT General Charges $OT Visit: 1 Visit OT Treatments $Self Care/Home Management : 8-22 mins $Neuromuscular Re-education: 8-22 mins  Darryl Nestle) Marsa Aris OTR/L Acute Rehabilitation  Services Pager: [phone] Office: [phone]    Audie Pinto 07/16/2018, 3:36 PM

## 2018-07-16 NOTE — Discharge Summary (Signed)
Name: Patricia Livingston MRN: [id number] DOB: September 20, 1960 58 y.o. PCP: [facility]  Date of Admission: 07/14/2018 11:08 AM Date of Discharge: 07/16/2018 Attending Physician: Dr. Heide Spark  Discharge Diagnosis: 1. CVA 2. Acute renal injury  Discharge Medications: Allergies as of 07/16/2018      Reactions   Penicillins Anaphylaxis, Other (See Comments)   Did it involve swelling of the face/tongue/throat, SOB, or low BP? Yes Did it involve sudden or severe rash/hives, skin peeling, or any reaction on the inside of your mouth or nose? Yes Did you need to seek medical attention at a hospital or doctor's office? Yes When did it last happen?childhood If all above answers are NO, may proceed with cephalosporin use.   Aspirin Itching, Rash, Tinitus   Sulfa Antibiotics Hives, Swelling, Rash      Medication List    TAKE these medications   atorvastatin 80 MG tablet Commonly known as: LIPITOR Take 1 tablet (80 mg total) by mouth daily at 6 PM.   clopidogrel 75 MG tablet Commonly known as: PLAVIX Take 1 tablet (75 mg total) by mouth daily.   FLINSTONES GUMMIES OMEGA-3 DHA PO Take 2 tablets by mouth daily.   lisinopril 20 MG tablet Commonly known as: ZESTRIL Take 20 mg by mouth daily.   metoprolol tartrate 50 MG tablet Commonly known as: LOPRESSOR Take 50 mg by mouth daily.   predniSONE 5 MG tablet Commonly known as: DELTASONE Take 5 mg by mouth daily.   Vitamin B-12 2500 MCG Subl Place 1 tablet under the tongue daily.       Disposition and follow-up:   Patricia Livingston was discharged from [facility] in Stable condition.  At the hospital follow up visit please address:  1.  CVA: Plavix and statin for life. Will need 30 day event monitor and to complete OT as an outpatient. Follow-up with [facility] is recommended in 4-6 weeks.  Elevated serum creatinine: Uncertain etiology but felt to be dehydration. BMP and UA at  follow-up with PCP.  2.  Labs / imaging needed at time of follow-up: BMP  3.  Pending labs/ test needing follow-up: n/a  Follow-up Appointments: Follow-up Information    [facility] up.   Specialty: Rehabilitation Why: They will be in contact with you by Friday to schedule an appointment Contact information: [address] [id number] mc [address] [place] [address] [phone]       [facility]. Call.   Why: Please call and schedule with Dr. Brandon Melnick. You will also need to schedule the 30 day heart monitor placement.  Contact information: [address]     [address] [place] [address] [phone]          Hospital Course by problem list: Overview: Patricia Livingston is a 58 year old female with past medical history significant for Crohn's disease status post partial colectomy now stable on long-term prednisone therapy who presented with left upper extremity weakness subsequently found to have multifocal infarcts on MRI.  A thorough evaluation was completed with lipid panel, A1c, CTA head and neck, MRI, and an echocardiogram. Most likely etiology of PAF but indeterminate as TEE was not opted out of by the patient.    CVA: MRI of the head revealed acute infarcts in the right frontoparietal junction consistent with her physical exam findings.  CT Angio of the head neck did not demonstrate large vessel occlusion.  Patient's 10-year ASCVD risk is less than 5%.  Patient's CHA2DS2-VASc  score was relatively high at 4 although no confirmed A-fib. She would have benefited from placement of a loop recorder and a TEE. However, she opted to forgo the loop recorder in leu of an event monitor. The TEE was deemed possibly problematic by the patient due to her history of esophageal stricture and crohns disease. Occupational therapy recommended outpatient OT which the patient will need to establish. She is to remain  on plavix 75 for life as well as a high/moderate intensity statin.   Prerenal acute renal injury: Renal function initially decreased, likely from dehydration. No clear etiology established. Renal function improved to near normal by discharge. Recommended repeat BMP and consider a UA if renal function remains unimproved in 1-2 weeks.   Discharge Vitals:   BP (!) 143/85 (BP Location: Left Arm)    Pulse 73    Temp 97.6 F (36.4 C) (Axillary)    Resp 18    Ht 5\' 6"  (1.676 m)    Wt 74.4 kg    SpO2 100%    BMI 26.47 kg/m   Pertinent Labs, Studies, and Procedures:  CBC Latest Ref Rng & Units 07/14/2018  WBC 4.0 - 10.5 K/uL 19.1(H)  Hemoglobin 12.0 - 15.0 g/dL 16.112.5  Hematocrit 09.636.0 - 46.0 % 38.7  Platelets 150 - 400 K/uL 286   CMP Latest Ref Rng & Units 07/16/2018 07/15/2018 07/14/2018  Glucose 70 - 99 mg/dL 045(W145(H) 93 94  BUN 6 - 20 mg/dL 16 17 09(W24(H)  Creatinine 0.44 - 1.00 mg/dL 1.19(J1.19(H) 4.78(G1.17(H) 9.56(O1.51(H)  Sodium 135 - 145 mmol/L 140 140 135  Potassium 3.5 - 5.1 mmol/L 4.0 3.9 4.4  Chloride 98 - 111 mmol/L 112(H) 113(H) 109  CO2 22 - 32 mmol/L 19(L) 19(L) 15(L)  Calcium 8.9 - 10.3 mg/dL 1.3(Y8.7(L) 8.6(V8.6(L) 7.8(I8.7(L)  Total Protein 6.5 - 8.1 g/dL - - 5.7(L)  Total Bilirubin 0.3 - 1.2 mg/dL - - 0.6  Alkaline Phos 38 - 126 U/L - - 79  AST 15 - 41 U/L - - 25  ALT 0 - 44 U/L - - 18   CTA Head and Neck: IMPRESSION: 1. 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 This is more prominent on the left than the right. No dissection or aneurysm. There is minimal atherosclerotic disease of the carotid bifurcation bilaterally 2. Negative for intracranial large vessel occlusion. Presumed embolus to the right frontal parietal lobe not visualized.  MRI Brain: IMPRESSION: Patchy acute infarctions at the right frontoparietal junction involving both the precentral and postcentral gyri. Few other punctate acute infarctions in both parietal lobes. Presence  of bilateral infarctions suggests embolic disease from the heart or ascending aorta. Background pattern of chronic small vessel ischemic changes of the cerebellum and hemispheric white matter. Negative intracranial MR angiography of the large and medium size vessels. Possible fibromuscular change of the upper cervical internal carotid arteries. Small right mastoid effusion.  Echocardiogram: IMPRESSIONS  1. The left ventricle has normal systolic function, with an ejection fraction of 55-60%. The cavity size was normal. There is mildly increased left ventricular wall thickness. Left ventricular diastolic Doppler parameters are consistent with impaired  relaxation. Indeterminate filling pressures The E/e' is 8-15.  2. The right ventricle has normal systolic function. The cavity was normal. There is no increase in right ventricular wall thickness.  3. The mitral valve is abnormal. Mild thickening of the mitral valve leaflet.  4. The tricuspid valve is grossly normal.  5. The aortic valve is grossly  normal. No stenosis of the aortic valve.   Discharge Instructions: Discharge Instructions    Ambulatory referral to Occupational Therapy   Complete by: As directed    Diet - low sodium heart healthy   Complete by: As directed    Discharge instructions   Complete by: As directed    You will need to discuss the outpatient occupational therapy with your primary care doctor or neurologist. This can not be arranged from the inpatient medicine side.  Please be certain to follow-up with neurology.  Please take your plavix and consult a physician prior to discontinuation. Given your stroke, this is key to preventing reoccurrence.  Please take the statin, if you feel the dose is too high, you can discuss taking a lower dose with your physician.   Increase activity slowly   Complete by: As directed       Signed: Kathi Ludwig, MD 07/19/2018, 7:50 AM   Pager: Pager# [phone]

## 2018-07-16 NOTE — Progress Notes (Signed)
Pt wants to address several questions to neurologist prior to the TEE : 1) How necessary is the TEE to determining the course of treatment? 2) Is the TEE safe for pts with a gastrointestinal stricture (on advancing the scope)? 3) Is plavix or aspirin safe for Crohn's disease considering the bleeding risk? 4) What are the risks of not having the TEE? 5) What are the risks of not taking a blood thinner?

## 2018-07-18 ENCOUNTER — Telehealth: Payer: Self-pay | Admitting: Neurology

## 2018-07-18 NOTE — Telephone Encounter (Signed)
I just spoke to  This pt and she called cardiac [place] to schedule heart monitor and she said they told her that Dr Leonie Man needs to put in order before they can schedule

## 2018-07-19 ENCOUNTER — Encounter: Payer: Self-pay | Admitting: Adult Health

## 2018-07-19 ENCOUNTER — Other Ambulatory Visit: Payer: Self-pay

## 2018-07-19 DIAGNOSIS — I639 Cerebral infarction, unspecified: Secondary | ICD-10-CM

## 2018-07-19 NOTE — Telephone Encounter (Signed)
I called pt that order was put in for cardiac monitor. I stated the [facility] cone group will call her to set the appt up or she can call them. Pt has number and verbalized understanding.

## 2018-07-19 NOTE — Progress Notes (Signed)
Order put in for cardiac monitor.

## 2018-07-20 ENCOUNTER — Telehealth: Payer: Self-pay | Admitting: Radiology

## 2018-07-20 NOTE — Telephone Encounter (Signed)
Enrolled patient for a 30 day [employer] event monitor to be mailed. Brief instructions were gone over with the patient and she knows to expect the monitor to arrive in 3-4 days. 

## 2018-07-26 ENCOUNTER — Encounter (INDEPENDENT_AMBULATORY_CARE_PROVIDER_SITE_OTHER): Payer: PRIVATE HEALTH INSURANCE

## 2018-07-26 DIAGNOSIS — I4891 Unspecified atrial fibrillation: Secondary | ICD-10-CM

## 2018-07-26 DIAGNOSIS — I639 Cerebral infarction, unspecified: Secondary | ICD-10-CM

## 2018-08-09 ENCOUNTER — Encounter: Payer: Self-pay | Admitting: Occupational Therapy

## 2018-08-09 ENCOUNTER — Other Ambulatory Visit: Payer: Self-pay

## 2018-08-09 ENCOUNTER — Ambulatory Visit: Payer: PRIVATE HEALTH INSURANCE | Attending: Internal Medicine | Admitting: Occupational Therapy

## 2018-08-09 DIAGNOSIS — R278 Other lack of coordination: Secondary | ICD-10-CM

## 2018-08-09 DIAGNOSIS — M6281 Muscle weakness (generalized): Secondary | ICD-10-CM | POA: Insufficient documentation

## 2018-08-09 NOTE — Therapy (Signed)
[facility] [address] [place], [place], [address] Phone: [phone]   Fax:  [phone]  Occupational Therapy Evaluation  Patient Details  Name: Patricia CoonsDiane Zecca MRN: [id number] Date of Birth: [birth year] Referring Provider (OT): Dr. Pearlean BrownieSethi    Encounter Date: 08/09/2018  OT End of Session - 08/09/18 1809    Visit Number  1    Number of Visits  8   pt will likely not need all 8 visits   Date for OT Re-Evaluation  09/06/18    Authorization Type  pt has nationwide policy and [employer] however does not know if they have rehab benefits. pt to check and will only attend 1-2 more sessions if she does not have coverage    OT Start Time  1602    OT Stop Time  1630    OT Time Calculation (min)  28 min    Activity Tolerance  Patient tolerated treatment well       Past Medical History:  Diagnosis Date  . Crohn's colitis (HCC)   . Hypertension     History reviewed. No pertinent surgical history.  There were no vitals filed for this visit.  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  No/denies        [facility] OT Assessment - 08/09/18 0001      Assessment   Medical Diagnosis  R frontoparietal CVA    Referring Provider (OT)  Dr. Pearlean BrownieSethi     Onset Date/Surgical Date  07/14/18    Hand Dominance  Right    Prior Therapy  acute are PT, OT and ST      Precautions   Precautions  None      Restrictions   Weight Bearing Restrictions  No      Balance Screen   Has the patient fallen in the past 6 months  No      Home  Environment   Family/patient expects to be discharged to:  Private residence    Living Arrangements  Alone    Available Help at Discharge  Available PRN/intermittently    Type of Home  House    Home Layout  Multi-level    Bathroom Shower/Tub  Tub/Shower unit    Bathroom Toilet  Standard    Additional Comments  Pt has no equipment      Prior Function   Level of Independence  Independent    Vocation  Full time employment    Vocation Requirements  works as full Geologist, 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       ADL   Eating/Feeding  Modified independent    Grooming  Modified independent    Upper Body Bathing  Independent    Lower Body Bathing  Independent    Upper Body Dressing  Increased time   increased time for buttons   Lower Body Dressing  Increased time   to ties Statisticianshoes   Toilet Transfer  Independent    Toileting - Programmer, applicationsClothing Manipulation  Modified independent    Toileting -  Geneticist, molecularHygiene  Independent    Tub/Shower Transfer  Independent      IADL   Shopping  Takes care of all shopping needs independently    Light Housekeeping  Maintains house alone or with occasional assistance    Meal Prep  Plans, prepares and serves adequate meals independently    Education officer, environmentalCommunity Mobility  Drives own vehicle    Medication Management  Is responsible for taking medication in correct dosages at correct time   difficulty with child caps/suggested getting regular caps   Physicist, medicalinancial Management  Manages financial matters independently (budgets, writes checks, pays rent, bills goes to bank), collects and keeps track of income      Mobility   Mobility Status  Independent    Mobility Status Comments  Feels like it takes more energy to walk around especially walking up hills      Written Expression   Dominant Hand  Right      Vision - History   Baseline Vision  Wears glasses only for reading    Additional Comments  Feels she sees better close up and not as clear  far away.        Activity Tolerance   Activity Tolerance  Tolerate 30+ min activity without fatigue    Activity Tolerance Comments  Feels overall that her energy level is not what it was - can go about an hour for basic self care and then needs to rest.       Cognition   Overall Cognitive Status  Within Functional Limits for tasks assessed    Mini Mental State Exam   Pt feels that perhaps her memory is not as good as it was.        Sensation   Light Touch  Appears Intact    Hot/Cold  Appears Intact    Proprioception  Appears Intact      Coordination   Gross Motor Movements are Fluid and Coordinated  Yes    Finger Nose Finger Test  mild slowing     9 Hole Peg Test  Right;Left    Right 9 Hole Peg Test  17.86    Left 9 Hole Peg Test  22.08    Coordination  Pt reports that L hand feels like it is uncoordinated and feels like it feels "weird".        Tone   Assessment Location  Left Upper Extremity      ROM / Strength   AROM / PROM / Strength  AROM;Strength      AROM   Overall AROM   Within functional limits for tasks performed    Overall AROM Comments  BUE's      Strength   Overall Strength  Deficits    Overall Strength Comments  LUE: shoulder flexion 4/5, mildly decreased grip strength, decreased pinch strength      Hand Function   Right Hand Gross Grasp  Functional    Right Hand Grip (lbs)  68    Right Hand Lateral Pinch  17 lbs    Right Hand 3 Point Pinch  14 lbs   2 pt = 12   Left Hand Gross Grasp  Impaired    Left Hand Grip (lbs)  60    Left Hand Lateral Pinch  16 lbs    Left 3 point pinch  12  lbs   2pt = 8     LUE Tone   LUE Tone  Within Functional Limits                           OT Long Term Goals - 08/09/18 1803      OT LONG TERM GOAL #1   Title  Pt will be mod I with HEP for grip/pinch strength as well as shoulder flexion strength in prep for returing to work - 09/06/2018    Status  New      OT LONG TERM GOAL #2   Title  Pt will  demonstrate at least 3 pound increase in L grip strength to assist with gross motor tasks    Status  New      OT LONG TERM GOAL #3   Title  Pt will demonstrate ability to perform simulated trach and peg care on practice equipment in prep for returning to work (pt to obtain practice equipment)    Status  New            Plan - 08/09/18 1805    Clinical Impression Statement  Pt is a 58 year old female s/p R frontoparietal CVA;  MRI also found bilateral parietal CVAs.  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 Pt will benefit from short course of skilled OT to address these deficits in prep for returning to work.    OT Occupational Profile and History  Problem Focused Assessment - Including review of records relating to presenting problem    Occupational performance deficits (Please refer to evaluation for details):  ADL's;IADL's;Work    Body Structure / Function / Physical Skills  ADL;Coordination;FMC;Strength;UE functional use    Rehab Potential  Good    Clinical Decision Making  Limited treatment options, no task modification necessary    Comorbidities Affecting Occupational Performance:  None    Modification or Assistance to Complete Evaluation   No modification of tasks or assist necessary to complete eval    OT Frequency  2x / week    OT Duration  4 weeks   pt will likely not need all 8 visits   OT Treatment/Interventions  Therapeutic exercise;Neuromuscular education;Therapeutic activities;Patient/family education    Plan  intiate HEP for coordination, grip and pinch strength    Consulted and Agree with Plan of Care  Patient       Patient will benefit from skilled therapeutic intervention in order to improve the following deficits and impairments:   Body Structure / Function /  Physical Skills: ADL, Coordination, FMC, Strength, UE functional use       Visit Diagnosis: 1. Muscle weakness (generalized)   2. Other lack of coordination       Problem List Patient Active Problem List   Diagnosis Date Noted  . CVA (cerebral vascular accident) ([facility]) 07/14/2018    Quay Burow, OTR/L 08/09/2018, [place] [address] [place], [place], [address] Phone: [phone]   Fax:  [phone]  Name: Julien Berryman MRN: [id number] Date of Birth: December 25, 1960

## 2018-08-21 ENCOUNTER — Ambulatory Visit (INDEPENDENT_AMBULATORY_CARE_PROVIDER_SITE_OTHER): Payer: PRIVATE HEALTH INSURANCE | Admitting: Adult Health

## 2018-08-21 ENCOUNTER — Other Ambulatory Visit: Payer: Self-pay

## 2018-08-21 ENCOUNTER — Encounter: Payer: Self-pay | Admitting: Adult Health

## 2018-08-21 VITALS — BP 115/77 | HR 61 | Temp 98.2°F | Ht 66.0 in | Wt 162.4 lb

## 2018-08-21 DIAGNOSIS — I639 Cerebral infarction, unspecified: Secondary | ICD-10-CM

## 2018-08-21 DIAGNOSIS — E785 Hyperlipidemia, unspecified: Secondary | ICD-10-CM | POA: Diagnosis not present

## 2018-08-21 DIAGNOSIS — I773 Arterial fibromuscular dysplasia: Secondary | ICD-10-CM

## 2018-08-21 DIAGNOSIS — I10 Essential (primary) hypertension: Secondary | ICD-10-CM

## 2018-08-21 NOTE — Progress Notes (Signed)
I agree with the above plan 

## 2018-08-21 NOTE — Patient Instructions (Addendum)
Continue clopidogrel 75 mg daily  and lipitor  for secondary stroke prevention  Complete 30 day cardiac monitor for possible atrial fibrillation  Start occupational therapy for possible improvement   Consider evaluation for potential underlying sleep apnea  Continue to follow up with PCP regarding cholesterol and blood pressure management   Continue to monitor blood pressure at home  Maintain strict control of hypertension with blood pressure goal below 130/90, diabetes with hemoglobin A1c goal below 6.5% and cholesterol with LDL cholesterol (bad cholesterol) goal below 70 mg/dL. I also advised the patient to eat a healthy diet with plenty of whole grains, cereals, fruits and vegetables, exercise regularly and maintain ideal body weight.  Followup in the future with me in 3 months or call earlier if needed       Thank you for coming to see [place] at [facility]. I hope we have been able to provide you high quality care today.  You may receive a patient satisfaction survey over the next few weeks. We would appreciate your feedback and comments so that we may continue to improve ourselves and the health of our patients.

## 2018-08-21 NOTE — Progress Notes (Signed)
[facility] [address] [place]. [place] [address] [phone]       HOSPITAL FOLLOW UP NOTE  [name] Date of Birth:  Mar 08, 1960 Medical Record Number:  [id number]   Reason for Referral:  hospital stroke follow up    CHIEF COMPLAINT:  Chief Complaint  Patient presents with  . Follow-up    Room 9, With son. Hospital f/u CVA.states that she is doing well.    HPI: Patricia Livingston being seen today for in office hospital follow-up regarding bilateral acute infarcts embolic pattern secondary to unknown source on 07/14/2018.  History obtained from patient, son and chart review. Reviewed all radiology images and labs personally.  [name] is a 58 y.o. female with history of hypertension and crohn's colitis (prednisone 5 mg daily) who presented to [facility] ED on 07/14/2018 with left sided weakness/numbness. She did not receive IV t-PA due to late presentation (>4.5 hours from time of onset).  Stroke work-up revealed bilateral acute infarcts embolic pattern secondary to unknown source with resultant left hand weakness.  CT head concerning for right frontal parietal infarct.  MRI head patchy acute infarctions at the right frontoparietal junction involving both precentral postcentral gyri and few other punctate acute infarctions in both parietal lobes.  MRA head negative for significant vessel occlusion with possible fibromuscular change of the upper cervical internal carotid arteries.  CTA head and neck showed mild fibromuscular dysplasia in the internal carotid artery below the skull base bilaterally.  2D echo unremarkable.  Due to aspirin allergy, initiated clopidogrel 75 mg daily.  HTN stable.  Initiated atorvastatin 80 mg daily for HLD management.  Other stroke risk factors include advanced age but no prior history of stroke.  Recommended outpatient 30-day cardiac event monitor at discharge.  Discharged home in stable condition with recommendation of outpatient  therapies.  Residual left hand weakness with decreased dexterity and overall general fatigue.  She does endorse improvement and will be starting occupational therapy on 08/23/2018. 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  She is concerned of returning to work with current deficits due to difficulty with daily tasks such as trach and PEG care Continues on clopidogrel without bleeding or bruising Continues on atorvastatin 80 mg daily without side effects myalgias Blood pressure stable today at 115/77 30-day cardiac event monitor will be completed next week She does endorse potentially having COVID-19 back in March despite negative testing and has been generally fatigued since that time.  She denies any worsening since recent stroke.  She currently works Health and safety inspector.  She does endorse snoring. Denies new or worsening stroke/TIA symptoms    ROS:   14 system review of systems performed and negative with exception of fatigue and weakness  PMH:  Past Medical History:  Diagnosis Date  . Crohn's colitis ([place])   . Hypertension     PSH: No past surgical history on file.  Social History:  Social History   Socioeconomic History  . Marital status: Widowed    Spouse name: Not on file  . Number of children: 2  . Years of education: Not on file  . Highest education level: Not on file  Occupational History  . Occupation: Marine scientist    Comment: [employer]  . Financial resource strain: Not on file  . Food insecurity    Worry: Never true    Inability: Never true  .  Transportation needs    Medical: Patient refused    Non-medical: Patient refused  Tobacco Use  . Smoking status: Never Smoker  . Smokeless tobacco: Never Used  Substance and Sexual Activity  . Alcohol use: Not Currently  . Drug use: Never  . Sexual activity: Not Currently  Lifestyle  . Physical activity    Days per  week: Not on file    Minutes per session: Not on file  . Stress: Not on file  Relationships  . Social Musicianconnections    Talks on phone: Not on file    Gets together: Not on file    Attends religious service: Not on file    Active member of club or organization: Not on file    Attends meetings of clubs or organizations: Not on file    Relationship status: Not on file  . Intimate partner violence    Fear of current or ex partner: Not on file    Emotionally abused: Not on file    Physically abused: Not on file    Forced sexual activity: Not on file  Other Topics Concern  . Not on file  Social History Narrative  . Not on file    Family History:  Family History  Problem Relation Age of Onset  . Hypertension Mother   . Hypertension Father     Medications:   Current Outpatient Medications on File Prior to Visit  Medication Sig Dispense Refill  . atorvastatin (LIPITOR) 80 MG tablet Take 1 tablet (80 mg total) by mouth daily at 6 PM. 30 tablet 1  . clopidogrel (PLAVIX) 75 MG tablet Take 1 tablet (75 mg total) by mouth daily. 30 tablet 1  . Cyanocobalamin (VITAMIN B-12) 2500 MCG SUBL Place 1 tablet under the tongue daily.    Marland Kitchen. lisinopril (ZESTRIL) 20 MG tablet Take 20 mg by mouth daily.    . metoprolol tartrate (LOPRESSOR) 50 MG tablet Take 50 mg by mouth daily.    . Pediatric Multiple Vit-C-FA (FLINSTONES GUMMIES OMEGA-3 DHA PO) Take 2 tablets by mouth daily.    . predniSONE (DELTASONE) 5 MG tablet Take 5 mg by mouth daily.    Marland Kitchen. triamcinolone lotion (KENALOG) 0.1 % APPLY LOTION TO AFFECTED AREA(S) TWICE DAILY AS NEEDED FOR EAR DERMATITIS     No current facility-administered medications on file prior to visit.     Allergies:   Allergies  Allergen Reactions  . Penicillins Anaphylaxis and Other (See Comments)    Did it involve swelling of the face/tongue/throat, SOB, or low BP? Yes Did it involve sudden or severe rash/hives, skin peeling, or any reaction on the inside of your mouth  or nose? Yes Did you need to seek medical attention at a hospital or doctor's office? Yes When did it last happen?childhood If all above answers are "NO", may proceed with cephalosporin use.   . Aspirin Itching, Rash and Tinitus  . Sulfa Antibiotics Hives, Swelling and Rash     Physical Exam  Vitals:   08/21/18 1343  BP: 115/77  Pulse: 61  Temp: 98.2 F (36.8 C)  Weight: 162 lb 6.4 oz (73.7 kg)  Height: 5\' 6"  (1.676 m)   Body mass index is 26.21 kg/m. No exam data present  Depression screen [facility] 08/21/2018  Decreased Interest 0  Down, Depressed, Hopeless 0  PHQ - 2 Score 0     General: well developed, well nourished, pleasant middle-age Caucasian female, seated, in no evident distress Head: head normocephalic and atraumatic.  Neck: supple with no carotid or supraclavicular bruits Cardiovascular: regular rate and rhythm, no murmurs Musculoskeletal: no deformity Skin:  no rash/petichiae Vascular:  Normal pulses all extremities   Neurologic Exam Mental Status: Awake and fully alert. Oriented to place and time. Recent and remote memory intact. Attention span, concentration and fund of knowledge appropriate. Mood and affect appropriate.  Cranial Nerves: Fundoscopic exam reveals sharp disc margins. Pupils equal, briskly reactive to light. Extraocular movements full without nystagmus. Visual fields full to confrontation. Hearing intact. Facial sensation intact. Face, tongue, palate moves normally and symmetrically.  Motor: Normal bulk and tone. Normal strength in all tested extremity muscles except mildly decreased left hand strength. Sensory.: intact to touch , pinprick , position and vibratory sensation.  Coordination: Rapid alternating movements normal in all extremities except decreased left hand dexterity. Finger-to-nose and heel-to-shin performed accurately bilaterally.  Mildly orbits right arm over left arm. Gait and Station: Arises from chair without difficulty.  Stance is normal. Gait demonstrates normal stride length and balance Reflexes: 1+ and symmetric. Toes downgoing.     NIHSS  0 Modified Rankin  2    Diagnostic Data (Labs, Imaging, Testing)  Ct Angio Head W Or Wo Contrast Ct Angio Neck W Or Wo Contrast 07/15/2018 IMPRESSION:  1. 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 This is more prominent on the left than the right. No dissection or aneurysm. There is minimal atherosclerotic disease of the carotid bifurcation bilaterally  2. Negative for intracranial large vessel occlusion. Presumed embolus to the right frontal parietal lobe not visualized.   Ct Head Wo Contrast 07/14/2018 IMPRESSION:  1. There is subtle hypodensity of the right frontoparietal junction deep white matter (series 4, image 20, 19), concerning for acute to subacute infarction. MRI may be used to further evaluate for acute diffusion restriction if desired.  2. No fracture or static subluxation of the cervical spine. Minimal multilevel disc space height loss and osteophytosis. MRI may be used to more sensitively evaluate for cervical disc and neural foraminal pathology if desired.    Ct Cervical Spine Wo Contrast 07/14/2018 IMPRESSION:  1. There is subtle hypodensity of the right frontoparietal junction deep white matter (series 4, image 20, 19), concerning for acute to subacute infarction. MRI may be used to further evaluate for acute diffusion restriction if desired. 2. No fracture or static subluxation of the cervical spine. Minimal multilevel disc space height loss and osteophytosis. MRI may be used to more sensitively evaluate for cervical disc and neural foraminal pathology if desired.   Mr Brain 9Wo Contrast Mr Angio Head Wo Contrast 07/14/2018 IMPRESSION:  Patchy acute infarctions at the right frontoparietal junction involving both the precentral and postcentral gyri. Few other punctate acute infarctions in  both parietal lobes. 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 Background pattern of chronic small vessel ischemic changes of the cerebellum and hemispheric white matter. Negative intracranial MR angiography of the large and medium size vessels. Possible fibromuscular change of the upper cervical internal carotid arteries. Small right mastoid effusion.    Transthoracic Echocardiogram -normal ejection fraction 55 to 60%.  No significant wall motion abnormalities.   EKG - SR rate 63 BPM. (See cardiology reading for complete details)   ASSESSMENT: Patricia Livingston is a 58 y.o. year old female here with patchy acute infarctions at the right frontal parietal junction involving both precentral and postcentral gyri and a few other punctate acute infarctions in both parietal  lobes secondary to unknown source on 07/14/2018.  Recommended 30-day cardiac event monitor to rule out atrial fibrillation.  Vascular risk factors include mild fibromuscular dysplasia, HTN, and HLD.  Residual deficits of mild left hand weakness and decreased dexterity and generalized fatigue.    PLAN:  1. Cryptogenic stroke: Continue clopidogrel 75 mg daily  and atorvastatin for secondary stroke prevention. Maintain strict control of hypertension with blood pressure goal below 130/90, diabetes with hemoglobin A1c goal below 6.5% and cholesterol with LDL cholesterol (bad cholesterol) goal below 70 mg/dL.  I also advised the patient to eat a healthy diet with plenty of whole grains, cereals, fruits and vegetables, exercise regularly with at least 30 minutes of continuous activity daily and maintain ideal body weight.  Complete 30-day cardiac event monitor for potential atrial fibrillation.  Discussion regarding assessment for atrial fibrillation and if negative, will be recommended to pursue TEE with potential loop recorder placement.  Currently declines at this time but will discuss  further if indicated once 30-day cardiac event monitor complete. 2. HTN: Advised to continue current treatment regimen.  Today's BP stable.  Advised to continue to monitor at home along with continued follow-up with PCP for management 3. HLD: Advised to continue current treatment regimen along with continued follow-up with PCP for future prescribing and monitoring of lipid panel 4. Fibromuscular dysplasia: Discussion regarding no need for intervention or additional imaging at this time as asymptomatic.  Highly encouraged ongoing management of blood pressure and cholesterol.  Advised to call 911 with any worsening or new stroke/TIA symptoms.  Education provided regarding increased risk for aneurysms or dissections. 5. Generalized fatigue: Likely residual deficit post stroke and previously working nights for numerous years.  Discussion regarding sleep apnea and potential evaluation with recent stroke but patient declines at this time.  She will notify office in the future if interested in pursuing evaluation 6. Residual deficits: Initiate OT with ongoing exercises at home 7. Discussion regarding Brien Matesrcadia trial and potential interest in participation.  Patient declines at this time    Follow up in 3 months or call earlier if needed   Greater than 50% of time during this 45 minute visit was spent on counseling, explanation of diagnosis of cryptogenic stroke, reviewing risk factor management of HTN, HLD, fibromuscular dysplasia, residual deficits of generalized fatigue and left hand decreased dexterity, planning of further management along with potential future management, and discussion with patient and family answering all questions.    George HughJessica Luchiano Viscomi, AGNP-BC  [facility] [address] [place], [place] [address]  Phone [phone] Fax [phone] Note: This document was prepared with digital dictation and possible smart phrase technology. Any  transcriptional errors that result from this process are unintentional.

## 2018-08-23 ENCOUNTER — Ambulatory Visit: Payer: PRIVATE HEALTH INSURANCE | Admitting: Occupational Therapy

## 2018-08-23 ENCOUNTER — Other Ambulatory Visit: Payer: Self-pay

## 2018-08-23 DIAGNOSIS — R278 Other lack of coordination: Secondary | ICD-10-CM

## 2018-08-23 DIAGNOSIS — M6281 Muscle weakness (generalized): Secondary | ICD-10-CM

## 2018-08-23 NOTE — Therapy (Signed)
[facility] [address] [place], [place], [address] Phone: [phone]   Fax:  [phone]  Occupational Therapy Treatment  Patient Details  Name: Patricia Livingston MRN: [id number] Date of Birth: 1960/10/13 Referring Provider (OT): Dr. Pearlean BrownieSethi    Encounter Date: 08/23/2018  OT End of Session - 08/23/18 1049    Visit Number  2    Number of Visits  8    Date for OT Re-Evaluation  09/06/18    Authorization Type  pt has nationwide policy and [employer] however does not know if they have rehab benefits. pt to check and will only attend 1-2 more sessions if she does not have coverage    OT Start Time  1000    OT Stop Time  1045    OT Time Calculation (min)  45 min    Activity Tolerance  Patient tolerated treatment well    Behavior During Therapy  WFL for tasks assessed/performed       Past Medical History:  Diagnosis Date  . Crohn's colitis (HCC)   . Hypertension     No past surgical history on file.  There were no vitals filed for this visit.  Subjective Assessment - 08/23/18 1004    Subjective   I am on medical leave now. I am getting BC/BS silver plan effective 08/26/18 so I should be covered next week. 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  No/denies         [facility] Assessment - 08/23/18 0001      Vision Assessment   Visual Fields  No apparent deficits    Comment  Pt appeared normal with double simultaneous stimulation as well. ? spatial deficits/judging distance on Lt side       CLINIC OPERATION CHANGES: Outpatient Neuro Rehab is open at lower capacity following universal masking, social distancing, and patient screening.  The patient's COVID risk of complications score is 2.    *Assessed visual fields and  inattention with no apparent deficits (secondary to pt reporting bumping into things occasionally on Lt side)   Pt issued coordination and putty HEP - see pt instructions for details. Pt return demo of each. Pt issued green resistance putty.  Began LUE strengthening with red theraband including: bilateral extension, Lt sh flexion, bilateral horizontal abduction, and Lt elbow flexion and extension. Pt performed each x 10 reps w/ cueing required for proper positioning. Did not have time to formally issue HEP - will issue next session                OT Education - 08/23/18 1035    Education Details  coordination HEP, Putty HEP    Person(s) Educated  Patient    Methods  Explanation;Demonstration;Handout    Comprehension  Verbalized understanding;Returned demonstration          OT Long Term Goals - 08/23/18 1052      OT LONG TERM GOAL #1   Title  Pt will be mod I with HEP for grip/pinch strength as well as shoulder flexion strength in prep for returing to work - 09/06/2018    Status  On-going      OT LONG TERM GOAL #2   Title  Pt  will demonstrate at least 3 pound increase in L grip strength to assist with gross motor tasks    Status  New      OT LONG TERM GOAL #3   Title  Pt will demonstrate ability to perform simulated trach and peg care on practice equipment in prep for returning to work (pt to obtain practice equipment)    Status  New            Plan - 08/23/18 1052    Clinical Impression Statement  Pt returns after initial evaluation w/ reports of bumping into Lt side but no apparent field cuts or inattention noted. Pt may have trouble judging distance or a slight balance deficit?    Occupational performance deficits (Please refer to evaluation for details):  ADL's;IADL's;Work    Body Structure / Function / Physical Skills  ADL;Coordination;FMC;Strength;UE functional use    OT Frequency  2x / week    OT Duration  4 weeks    OT Treatment/Interventions   Therapeutic exercise;Neuromuscular education;Therapeutic activities;Patient/family education    Plan  review coordination and putty HEP prn, review theraband HEP and issue handout       Patient will benefit from skilled therapeutic intervention in order to improve the following deficits and impairments:   Body Structure / Function / Physical Skills: ADL, Coordination, FMC, Strength, UE functional use       Visit Diagnosis: 1. Other lack of coordination   2. Muscle weakness (generalized)       Problem List Patient Active Problem List   Diagnosis Date Noted  . CVA (cerebral vascular accident) ([place]) 07/14/2018    Carey Bullocks, OTR/L 08/23/2018, 10:54 AM  [facility] [address] [place], [place], [address] Phone: [phone]   Fax:  [phone]  Name: Patricia Livingston MRN: [id number] Date of Birth: 1960/06/28

## 2018-08-23 NOTE — Patient Instructions (Signed)
  1. Grip Strengthening (Resistive Putty)   Squeeze putty using thumb and all fingers. Repeat _20___ times. Do __2__ sessions per day.   2. Roll putty into tube on table and pinch between first two finger and thumb x 10 reps. Do 2 sessions per day    Coordination Activities  Perform the following activities for 10 minutes 1-2 times per day with left hand(s).   Rotate ball in fingertips (clockwise and counter-clockwise).  Toss ball in air and catch with the same hand.  Flip cards 1 at a time as fast as you can.  Deal cards with your thumb (Hold deck in hand and push card off top with thumb).  Rotate one card in hand (clockwise and counter-clockwise).  Pick up coins one at a time until you get 5 in your hand, then move coins from palm to fingertips to stack one at a time.  Twirl pen between first 3 fingers clockwise and counterclockwise.  Practice braiding string and stringing beads (can use macaroni noodle and dental floss)

## 2018-08-25 ENCOUNTER — Encounter

## 2018-08-29 ENCOUNTER — Other Ambulatory Visit: Payer: Self-pay

## 2018-08-29 ENCOUNTER — Ambulatory Visit: Payer: PRIVATE HEALTH INSURANCE | Attending: Internal Medicine | Admitting: Occupational Therapy

## 2018-08-29 ENCOUNTER — Other Ambulatory Visit: Payer: Self-pay | Admitting: Neurology

## 2018-08-29 DIAGNOSIS — M6281 Muscle weakness (generalized): Secondary | ICD-10-CM | POA: Insufficient documentation

## 2018-08-29 DIAGNOSIS — I4891 Unspecified atrial fibrillation: Secondary | ICD-10-CM

## 2018-08-29 DIAGNOSIS — I639 Cerebral infarction, unspecified: Secondary | ICD-10-CM

## 2018-08-29 DIAGNOSIS — R278 Other lack of coordination: Secondary | ICD-10-CM | POA: Insufficient documentation

## 2018-08-29 NOTE — Patient Instructions (Signed)
   Strengthening: Resisted Flexion   Hold tubing with __Lt___ arm(s) at side. Pull forward and up. Move shoulder through pain-free range of motion. Repeat __10__ times per set.  Do _1-2_ sessions per day , every other day   Strengthening: Resisted Extension   Hold tubing in __BOTH___ hand(s), arm forward. Pull arm back, elbow straight. Repeat _10___ times per set. Do _1-2___ sessions per day, every other day.   Resisted Horizontal Abduction: Bilateral   Sit or stand, tubing in both hands, arms out in front. Keeping arms straight, pinch shoulder blades together and stretch arms out. Repeat _10___ times per set. Do _1-2___ sessions per day, every other day.   Elbow Flexion: Resisted   With tubing held in __Lt____ hand(s) and other end secured under foot, curl arm up as far as possible. Repeat _10___ times per set. Do _1-2___ sessions per day, every other day.    Elbow Extension: Resisted   Sit in chair with resistive band secured at armrest (or hold with other hand) and __Lt_____ elbow bent. Straighten elbow. Repeat _10___ times per set.  Do _1-2___ sessions per day, every other day.   Copyright  VHI. All rights reserved.

## 2018-08-29 NOTE — Therapy (Signed)
[facility] [address] [place], [place], [address] Phone: [phone]   Fax:  [phone]  Occupational Therapy Treatment  Patient Details  Name: Patricia Livingston MRN: [id number] Date of Birth: Jun 19, 1960 Referring Provider (OT): Dr. Pearlean Brownie    Encounter Date: 08/29/2018  OT End of Session - 08/29/18 1413    Visit Number  3    Number of Visits  8    Date for OT Re-Evaluation  09/06/18    Authorization Type  Pt has new insurance and has agreed to pay for today's session - but was directed to our front office staff to answer any questions as pt may be out of network at this time    OT Start Time  1100    OT Stop Time  1145    OT Time Calculation (min)  45 min    Activity Tolerance  Patient tolerated treatment well    Behavior During Therapy  [facility] for tasks assessed/performed       Past Medical History:  Diagnosis Date  . Crohn's colitis (HCC)   . Hypertension     No past surgical history on file.  There were no vitals filed for this visit.  Subjective Assessment - 08/29/18 1109    Subjective   The more I work my hand, I get numbness in my hand (all fingers). 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  Yes    Pain Score  4     Pain Location  Elbow   down to fingers - ulnar side   Pain Orientation  Left    Pain Descriptors / Indicators  Radiating;Aching;Dull    Pain Type  Acute pain    Pain Onset  In the past 7 days    Pain Frequency  Intermittent    Aggravating Factors   worse in the evening, after exercises    Pain Relieving Factors  rest, OTC meds        Reviewed all coordination activities.  Pt issued formal HEP for theraband ex's  - see pt instructions for details. Pt performed each x 10 reps.                    OT Education - 08/29/18 1116    Education Details  [employer] HEP    Person(s) Educated  Patient    Methods  Explanation;Demonstration;Handout    Comprehension  Verbalized understanding;Returned demonstration          OT Long Term Goals - 08/29/18 1415      OT LONG TERM GOAL #1   Title  Pt will be mod I with HEP for grip/pinch strength as well as shoulder flexion strength in prep for returing to work - 09/06/2018    Status  Achieved      OT LONG TERM GOAL #2   Title  Pt will demonstrate at least 3 pound increase in L grip strength to assist with gross motor tasks    Status  On-going      OT LONG TERM GOAL #3   Title  Pt will demonstrate ability to perform simulated trach  and peg care on practice equipment in prep for returning to work (pt to obtain practice equipment)    Status  New            Plan - 08/29/18 1415    Clinical Impression Statement  Pt progressing with coordination ex's, putty ex's, and strengthening ex's for LUE. Pt did report occasional numbness along ulnar side from elbow to hand (but all fingers) after performing ex's.    Occupational performance deficits (Please refer to evaluation for details):  ADL's;IADL's;Work    Body Structure / Function / Physical Skills  ADL;Coordination;FMC;Strength;UE functional use    Rehab Potential  Good    OT Frequency  2x / week    OT Duration  4 weeks    OT Treatment/Interventions  Therapeutic exercise;Neuromuscular education;Therapeutic activities;Patient/family education    Plan  Pt to further discuss w/ front office new insurance and will d/c if pt is out of network, and seek O.T. services within a facility that is in network. Pt has agreed to pay for this visit prior to treatment. Will d/c episode of care if pt does not return d/t insurance needs       Patient will benefit from skilled therapeutic intervention in order  to improve the following deficits and impairments:   Body Structure / Function / Physical Skills: ADL, Coordination, FMC, Strength, UE functional use       Visit Diagnosis: 1. Muscle weakness (generalized)   2. Other lack of coordination       Problem List Patient Active Problem List   Diagnosis Date Noted  . CVA (cerebral vascular accident) ([place]) 07/14/2018    Carey Bullocks, OTR/L 08/29/2018, 2:19 PM  [facility] [address] [place], [place], [address] Phone: [phone]   Fax:  [phone]  Name: Patricia Livingston MRN: [id number] Date of Birth: [birth year]

## 2018-08-31 ENCOUNTER — Ambulatory Visit: Payer: PRIVATE HEALTH INSURANCE | Admitting: Occupational Therapy

## 2018-09-05 ENCOUNTER — Ambulatory Visit: Payer: PRIVATE HEALTH INSURANCE | Admitting: Occupational Therapy

## 2018-09-07 ENCOUNTER — Encounter: Payer: PRIVATE HEALTH INSURANCE | Admitting: Occupational Therapy

## 2018-09-12 ENCOUNTER — Encounter: Payer: PRIVATE HEALTH INSURANCE | Admitting: Occupational Therapy

## 2018-09-14 ENCOUNTER — Encounter: Payer: PRIVATE HEALTH INSURANCE | Admitting: Occupational Therapy

## 2018-11-22 ENCOUNTER — Ambulatory Visit: Payer: PRIVATE HEALTH INSURANCE | Admitting: Adult Health

## 2019-10-26 IMAGING — CT CT ANGIOGRAPHY HEAD
2 of 7 series · 8 of 33 positions shown · IV contrast (APPLIED)
Comparison: CT and MRI 07/14/2018

CLINICAL DATA: Right MCA stroke

EXAM:
CT ANGIOGRAPHY HEAD AND NECK
TECHNIQUE: Multidetector CT imaging of the head and neck was performed using
the standard protocol during bolus administration of intravenous
contrast. Multiplanar CT image reconstructions and MIPs were
obtained to evaluate the vascular anatomy. Carotid stenosis
measurements (when applicable) are obtained utilizing NASCET
criteria, using the distal internal carotid diameter as the
denominator.
CONTRAST:  50mL OMNIPAQUE IOHEXOL 350 MG/ML SOLN

[Series 5: cta neck/head · axial · 0.52mm/px · z∈[+1058,+1172]mm · 2 of 173 slices shown]
[im 58/173  soft-tissue]
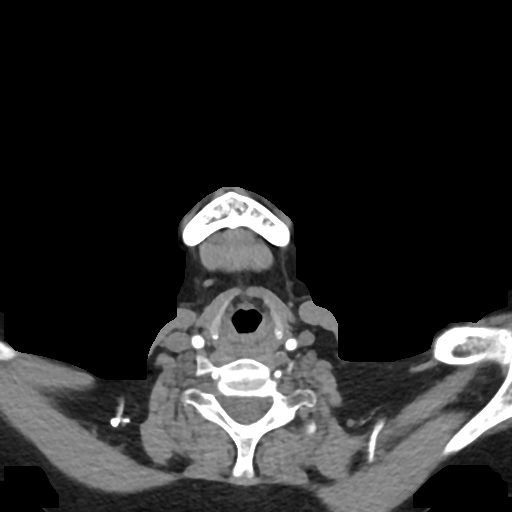
[im 115/173  soft-tissue]
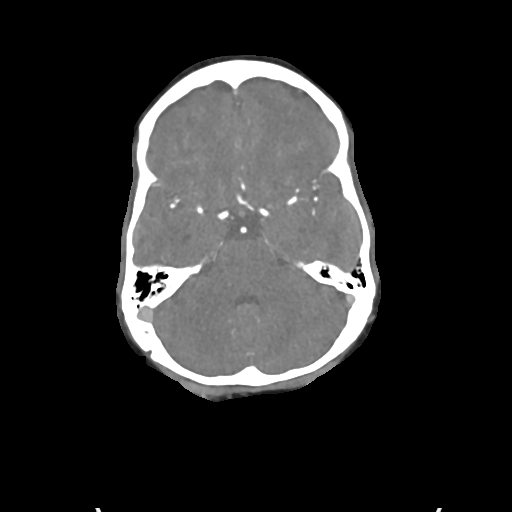

[Series 7: ax thins · axial · 0.53mm/px · z∈[+950,+1197]mm · 6 of 358 slices shown]
[im 52/358  soft-tissue]
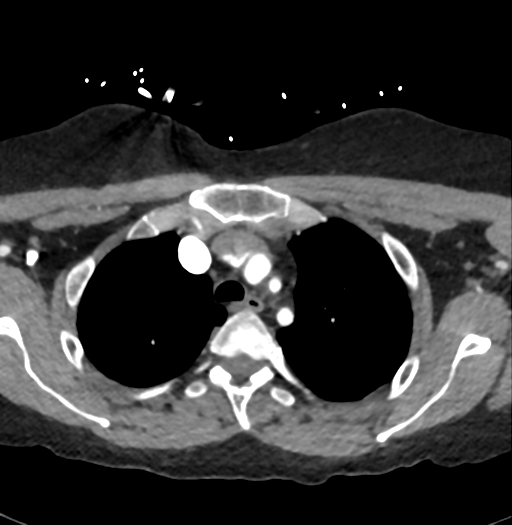
[im 103/358  bone]
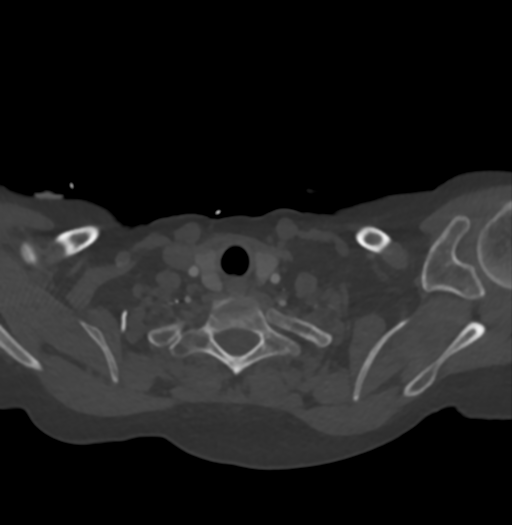
[im 154/358  soft-tissue]
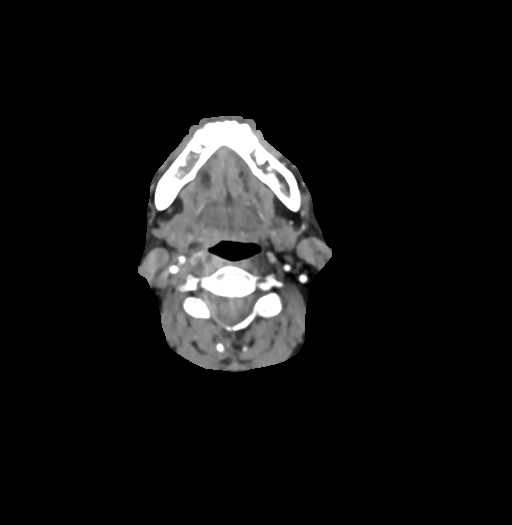
[im 205/358  bone]
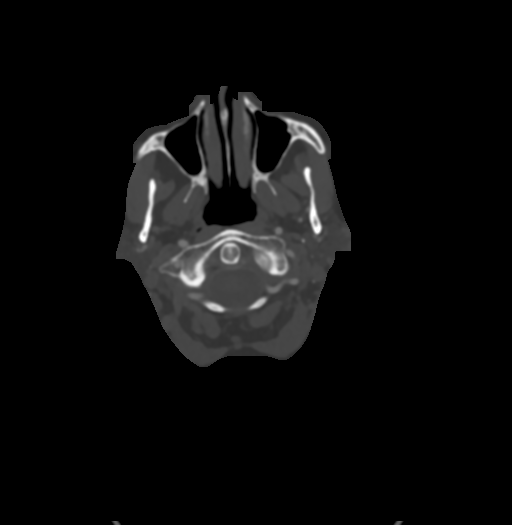
[im 256/358  soft-tissue]
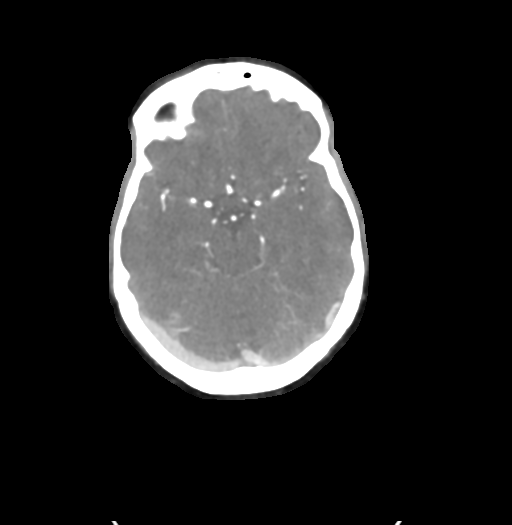
[im 307/358  bone]
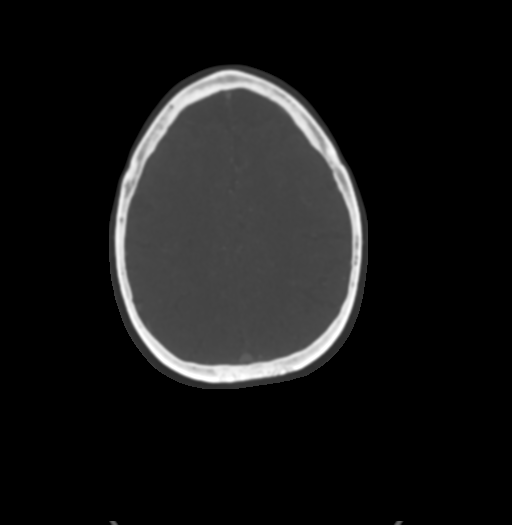

[8 of 33 positions shown; findings below may reference images not displayed]

FINDINGS: CTA NECK FINDINGS

Aortic arch: Standard branching. Imaged portion shows no evidence of
aneurysm or dissection. No significant stenosis of the major arch
vessel origins.

Right carotid system: Minimal atherosclerotic calcification right
carotid bifurcation without significant stenosis. Slight beaded
appearance of the right internal carotid artery below the skull
base. This is a subtle finding but when taken in consideration with
FMD changes on the left this is likely mild FMD on the right as
well. No dissection or aneurysm on the right.

Left carotid system: Minimal atherosclerotic disease left carotid
bifurcation without stenosis. Mild beaded appearance of the left
internal carotid artery below the skull base compatible with
fibromuscular dysplasia. No aneurysm or dissection.

Vertebral arteries: Both vertebral arteries widely patent without
stenosis or irregularity.

Skeleton: Negative

Other neck: Negative

Upper chest: Lung apices clear bilaterally. Calcific granuloma left
upper lobe.

Review of the MIP images confirms the above findings

CTA HEAD FINDINGS

Anterior circulation: Cavernous carotid widely patent bilaterally
without stenosis or aneurysm. No significant atherosclerotic
disease. Anterior and middle cerebral arteries patent bilaterally
without stenosis. No branch occlusion corresponding to the right MCA
infarct.

Posterior circulation: Both vertebral arteries patent to the
basilar. PICA patent bilaterally. Basilar widely patent. Superior
cerebellar and posterior cerebral arteries patent bilaterally
without stenosis.

Venous sinuses: Normal venous enhancement.

Anatomic variants: None

Delayed phase: Not perform

Review of the MIP images confirms the above findings
IMPRESSION: 1. Mild fibromuscular dysplasia in the internal carotid artery below
the skull base bilaterally. This is more prominent on the left than
the right. No dissection or aneurysm. There is minimal
atherosclerotic disease of the carotid bifurcation bilaterally
2. Negative for intracranial large vessel occlusion. Presumed
embolus to the right frontal parietal lobe not visualized.
